# Patient Record
Sex: Female | Born: 1975 | Race: White | Hispanic: No | Marital: Married | Smoking: Former smoker
Health system: Southern US, Community
[De-identification: ages and names within clinical notes are randomized; demographics above are authoritative.]

## PROBLEM LIST (undated history)

## (undated) DIAGNOSIS — E669 Obesity, unspecified: Secondary | ICD-10-CM

## (undated) DIAGNOSIS — Z87442 Personal history of urinary calculi: Secondary | ICD-10-CM

## (undated) DIAGNOSIS — R112 Nausea with vomiting, unspecified: Secondary | ICD-10-CM

## (undated) DIAGNOSIS — G971 Other reaction to spinal and lumbar puncture: Secondary | ICD-10-CM

## (undated) DIAGNOSIS — G709 Myoneural disorder, unspecified: Secondary | ICD-10-CM

## (undated) DIAGNOSIS — R519 Headache, unspecified: Secondary | ICD-10-CM

## (undated) DIAGNOSIS — E785 Hyperlipidemia, unspecified: Secondary | ICD-10-CM

## (undated) DIAGNOSIS — M1990 Unspecified osteoarthritis, unspecified site: Secondary | ICD-10-CM

## (undated) DIAGNOSIS — C801 Malignant (primary) neoplasm, unspecified: Secondary | ICD-10-CM

## (undated) DIAGNOSIS — J449 Chronic obstructive pulmonary disease, unspecified: Secondary | ICD-10-CM

## (undated) DIAGNOSIS — Z9889 Other specified postprocedural states: Secondary | ICD-10-CM

## (undated) DIAGNOSIS — N189 Chronic kidney disease, unspecified: Secondary | ICD-10-CM

## (undated) DIAGNOSIS — G4700 Insomnia, unspecified: Secondary | ICD-10-CM

## (undated) DIAGNOSIS — R51 Headache: Secondary | ICD-10-CM

## (undated) DIAGNOSIS — Z5189 Encounter for other specified aftercare: Secondary | ICD-10-CM

## (undated) DIAGNOSIS — I10 Essential (primary) hypertension: Secondary | ICD-10-CM

## (undated) DIAGNOSIS — Z8669 Personal history of other diseases of the nervous system and sense organs: Secondary | ICD-10-CM

## (undated) DIAGNOSIS — M797 Fibromyalgia: Secondary | ICD-10-CM

## (undated) DIAGNOSIS — F419 Anxiety disorder, unspecified: Secondary | ICD-10-CM

## (undated) DIAGNOSIS — D649 Anemia, unspecified: Secondary | ICD-10-CM

## (undated) DIAGNOSIS — R7303 Prediabetes: Secondary | ICD-10-CM

## (undated) DIAGNOSIS — K219 Gastro-esophageal reflux disease without esophagitis: Secondary | ICD-10-CM

## (undated) HISTORY — DX: Essential (primary) hypertension: I10

## (undated) HISTORY — PX: DILATION AND CURETTAGE OF UTERUS: SHX78

## (undated) HISTORY — DX: Fibromyalgia: M79.7

## (undated) HISTORY — DX: Chronic obstructive pulmonary disease, unspecified: J44.9

## (undated) HISTORY — DX: Encounter for other specified aftercare: Z51.89

## (undated) HISTORY — PX: BLADDER INSTILLATION: SUR156

## (undated) HISTORY — PX: OTHER SURGICAL HISTORY: SHX169

## (undated) HISTORY — DX: Chronic kidney disease, unspecified: N18.9

## (undated) HISTORY — DX: Obesity, unspecified: E66.9

## (undated) HISTORY — DX: Unspecified osteoarthritis, unspecified site: M19.90

## (undated) HISTORY — PX: TUBAL LIGATION: SHX77

## (undated) HISTORY — DX: Hyperlipidemia, unspecified: E78.5

## (undated) HISTORY — DX: Malignant (primary) neoplasm, unspecified: C80.1

## (undated) HISTORY — DX: Insomnia, unspecified: G47.00

---

## 2017-05-22 ENCOUNTER — Other Ambulatory Visit (HOSPITAL_COMMUNITY): Payer: Self-pay | Admitting: General Surgery

## 2017-05-25 ENCOUNTER — Other Ambulatory Visit: Payer: Self-pay

## 2017-05-25 ENCOUNTER — Ambulatory Visit (HOSPITAL_COMMUNITY)
Admission: RE | Admit: 2017-05-25 | Discharge: 2017-05-25 | Disposition: A | Discharge status: Home / Self Care | Payer: Medicare Other | Source: Ambulatory Visit | Attending: General Surgery | Admitting: General Surgery

## 2017-06-08 ENCOUNTER — Ambulatory Visit: Payer: Medicare Other | Admitting: Skilled Nursing Facility1

## 2017-06-11 ENCOUNTER — Encounter: Payer: Self-pay | Admitting: Skilled Nursing Facility1

## 2017-06-11 ENCOUNTER — Encounter: Payer: Medicare Other | Attending: General Surgery | Admitting: Skilled Nursing Facility1

## 2017-06-11 DIAGNOSIS — Z713 Dietary counseling and surveillance: Secondary | ICD-10-CM | POA: Diagnosis present

## 2017-06-11 DIAGNOSIS — Z6841 Body Mass Index (BMI) 40.0 and over, adult: Secondary | ICD-10-CM | POA: Insufficient documentation

## 2017-06-11 DIAGNOSIS — E669 Obesity, unspecified: Secondary | ICD-10-CM

## 2017-06-11 NOTE — Progress Notes (Addendum)
Pre-Op Assessment Visit:  Pre-Operative Sleeve Gastrectomy Surgery  Medical Nutrition Therapy:  Appt start time: 2:00  End time:  3:15  Patient was seen on 06/11/2017 for Pre-Operative Nutrition Assessment. Assessment and letter of approval faxed to [facility] Bariatric Surgery Program coordinator on 06/11/2017.   Pt states she feels like she is starving all the time: with no discernable fullness feeling. Pt states swimming is the only relief for her joints. Pt states she has stress/lack of sleep migraines often. Pt states she wants to move more but needs to lose weight first. Pt states she craves potato chips but does not like potato chips and eating a lot of eggs and not understanding why. Pt states 30 minute after eating: sweaty, lightheaded, and dizzy and a feeling of an empty stomach. Pt states she wakes up int the middle of the night feeling absolutely starving all happening in the last 2 years. Pt has a hx of gestational diabetes and has prediabetes (A1C 6).  Pt was given one touch verio lot: [id number] x exp: 09/14/18 got the number of 135 4 hours after eating.   Pt expectation of surgery: for my life to not revolve around food and weight, to not feel so hungry all the time  Pt expectation of Dietitian: I do not know  Start weight at [facility]: 217.4 BMI: 42.04   24 hr Dietary Recall: First Meal: scrambled eggs and mayo or oatmeal Snack: lemons Second Meal: sandwich with scrambled eggs and mayo or banana and mayo sandwich Snack: potato chips Third Meal: meatloaf mashed potatoes, corn, biscuits or spagetti and garlic bread Snack:  Beverages: flavored water, regular coffee  Encouraged to engage in 150 minutes of moderate physical activity including cardiovascular and weight baring weekly  Handouts given during visit include:  . Pre-Op Goals . Bariatric Surgery Protein Shakes During the appointment today the following Pre-Op Goals were reviewed with the patient: . Maintain  or lose weight as instructed by your surgeon . Make healthy food choices . Begin to limit portion sizes . Limited concentrated sugars and fried foods . Keep fat/sugar in the single digits per serving on             food labels . Practice CHEWING your food  (aim for 30 chews per bite or until applesauce consistency) . Practice not drinking 15 minutes before, during, and 30 minutes after each meal/snack . Avoid all carbonated beverages  . Avoid/limit caffeinated beverages  . Avoid all sugar-sweetened beverages . Consume 3 meals per day; eat every 3-5 hours . Make a list of non-food related activities . Aim for 64-100 ounces of FLUID daily  . Aim for at least 60-80 grams of PROTEIN daily . Look for a liquid protein source that contain ?15 g protein and ?5 g carbohydrate  (ex: shakes, drinks, shots) -Check your blood sugar every time you feel starving and bring the log to pre-op class -Follow diet recommendations listed below   Energy and Macronutrient Recomendations: Calories: 1500 Carbohydrate: 170 Protein: 112 Fat: 42  Demonstrated degree of understanding via:  Teach Back  Teaching Method Utilized:  Visual Auditory Hands on  Barriers to learning/adherence to lifestyle change: unknown possibly hormonal issue causing hunger/fullness confusion  Patient to call the [facility] to enroll in Pre-Op and Post-Op Nutrition Education when surgery date is scheduled.

## 2017-06-29 ENCOUNTER — Encounter: Payer: Medicare Other | Attending: General Surgery | Admitting: Registered"

## 2017-06-29 ENCOUNTER — Ambulatory Visit: Payer: Medicare Other

## 2017-06-29 DIAGNOSIS — Z6841 Body Mass Index (BMI) 40.0 and over, adult: Secondary | ICD-10-CM | POA: Insufficient documentation

## 2017-06-29 DIAGNOSIS — E669 Obesity, unspecified: Secondary | ICD-10-CM

## 2017-06-29 DIAGNOSIS — Z713 Dietary counseling and surveillance: Secondary | ICD-10-CM | POA: Insufficient documentation

## 2017-06-29 NOTE — Progress Notes (Signed)
  Pre-Operative Nutrition Class:  Appt start time: 8:15   End time:  9:15.  Patient was seen on 06/29/2017 for Pre-Operative Bariatric Surgery Education at the [facility].   Surgery date: 07/13/2017 Surgery type: Sleeve gastrectomy Start weight at [facility]: 217.4 Weight today: 220.2   Samples given per MNT protocol. Patient educated on appropriate usage: Bariatric Advantage Multivitamin Lot # [id number] Exp: 03/2018  Bariatric Advantage Calcium Citrate (peanut butter chocolate) Lot # [id number] Exp: 07/17/2017  Bariatric Advantage Calcium Citrate (lemon) Lot # [id number] Exp: 05/28/2018  Renee Pain Protein Powder (chocolate) Lot # [id number] Exp: 11/2017   Unjury Protein Powder (strawberry) Lot # [id number] Exp: 11/2017   The following the learning objectives were met by the patient during this course:  Identify Pre-Op Dietary Goals and will begin 2 weeks pre-operatively  Identify appropriate sources of fluids and proteins   State protein recommendations and appropriate sources pre and post-operatively  Identify Post-Operative Dietary Goals and will follow for 2 weeks post-operatively  Identify appropriate multivitamin and calcium sources  Describe the need for physical activity post-operatively and will follow MD recommendations  State when to call healthcare provider regarding medication questions or post-operative complications  Handouts given during class include:  Pre-Op Bariatric Surgery Diet Handout  Protein Shake Handout  Post-Op Bariatric Surgery Nutrition Handout  [facility] Program Information Flyer  Support Group Information Flyer  [facility] Bariatric Supplements Price List  Follow-Up Plan: Patient will follow-up at [facility] 2 weeks post operatively for diet advancement per MD.

## 2017-06-30 NOTE — Progress Notes (Signed)
Please place orders in EPIC as patient is being scheduled for a pre-op appointment! Thank you! 

## 2017-07-01 NOTE — Progress Notes (Signed)
Please place orders in EPIC as patient is being scheduled for a pre-op appointment! Thank you! 

## 2017-07-02 ENCOUNTER — Ambulatory Visit: Payer: Self-pay | Admitting: General Surgery

## 2017-07-02 NOTE — H&P (Signed)
Nicole [name] 07/02/2017 3:47 PM Location: [facility] Patient #: [id number] DOB: [birth year] Married / Language: [name] / Race: White Female  History of Present Illness Randall Hiss M. Redford Behrle MD; 07/02/2017 6:20 PM) The patient is a 41 year old female who presents for a bariatric surgery evaluation. She comes in today for her preoperative appointment. She is in the process of being approved for laparoscopic sleeve gastrectomy by the insurance company. I initially met her about 6 weeks ago. She denies any medical changes since her initial visit. She denies any trips the emergency room or hospital. Her upper GI was unremarkable. Her EKG was unremarkable. Chest x-ray was within normal limits. She had had most of her bariatric evaluation labs done in May 2018. A metabolic panel and CBC were within normal limits. An essentially normal lipid panel. Labs that we check revealed an iron level of 26 which is low. and prediabetes with the A1c of 6.0. Her sleep study from 3 years ago did show no evidence of significant sleep apnea.  A comprehensive 12 point review of systems was performed and all systems are negative except for what is mentioned in the HPI  05/20/2017 She is referred by [name] PA-C for evaluation of weight loss surgery. She completed our Neurosurgeon. She also attended another programs seminar in person. She is interested in the sleeve gastrectomy. She states that she wants to take control of her life again. Despite numerous attempts were sustained weight loss she has been unsuccessful. She has tried [name], [name], [name], Keystone, biggest loser contest, [name], and phentermine-all without any long-term success.  Her comorbidities include hypertension multiple joint pain, fibromyalgia, GERD  She denies any chest pain, chest pressure, source of breath, orthopnea, paroxysmal nocturnal dyspnea, TIAs or amaurosis fugax. She denies any personal  history of blood clots. She occasionally may have some dyspnea on exertion. She states that she had a normal sleep study in 2015. She use to be a heavy smoker but quit in 2016. She takes medicine for heartburn. She states that she does not take her medication she will have burning in her esophagus and stomach. She denies any nausea, vomiting, melena or hematochezia. She states that she has IBS alternating between diarrhea and constipation. She denies any history of Crohn's or ulcerative colitis. She has irregular menses. She has had a tubal ligation. She has a history kidney stones. She has a stimulator for a week the bladder. She has joint pain mainly in her hands and elbows. She denies any polyuria or polydipsia. She has occasional migraines only when she takes phentermine. She denies any current tobacco usage. She drinks alcohol on occasional basis. She denies any drug use.   Problem List/Past Medical [name], MD; 07/02/2017 6:22 PM) GERD WITHOUT ESOPHAGITIS (K21.9) FIBROMYALGIA (M79.7) OBESITY, MORBID, BMI 40.0-49.9 (E66.01)  Past Surgical History [name], MD; 07/02/2017 6:22 PM) Breast Augmentation Bilateral. Knee Surgery Left. Oral Surgery Spinal Surgery - Neck  Diagnostic Studies History [name], MD; 07/02/2017 6:22 PM) Colonoscopy 5-10 years ago Mammogram 1-3 years ago Pap Smear 1-5 years ago  Allergies [name], RMA; 07/02/2017 3:47 PM) Codeine Phosphate *ANALGESICS - OPIOID* Itching, Nausea. Allergies Reconciled  Medication History [name], MD; 07/02/2017 6:22 PM) [name] ('350MG'$  Tablet, Oral) Active. Escitalopram Oxalate ('10MG'$  Tablet, Oral) Active. Omeprazole ('40MG'$  Capsule DR, Oral) Active. Propranolol HCl ('80MG'$  Tablet, Oral) Active. Melatonin ER ('10MG'$  Tablet ER, Oral) Active. Prenatal Vitamin (Oral) Active. Magnesium ('500MG'$  Tablet, Oral) Active.  Probiotic Daily (Oral) Active. Calcium (Oral)  Specific strength unknown - Active. Medications Reconciled OxyCODONE HCl ('5MG'$ /5ML Solution, 5-10 Milliliter Oral every four hours, as needed, Taken starting 07/02/2017) Active. Zofran ODT ('4MG'$  Tablet Disint, 1 (one) Tablet Oral every six hours, as needed, Taken starting 07/02/2017) Active. (As needed for nasuea)  Social History [name], MD; 07/02/2017 6:22 PM) Alcohol use Occasional alcohol use. Caffeine use Carbonated beverages, Coffee. No drug use Tobacco use Former smoker.  Family History [name], MD; 07/02/2017 6:22 PM) Alcohol Abuse Brother, Family Members In General, Father, Mother. Breast Cancer Mother. Colon Cancer Father. Heart disease in female family member before age 63 Heart disease in female family member before age 61 Hypertension Father. Migraine Headache Daughter. Prostate Cancer Father.  Pregnancy / Birth History [name], MD; 07/02/2017 6:22 PM) Age at menarche 43 years. Contraceptive History Depo-provera, Intrauterine device, Oral contraceptives. Gravida 4 Irregular periods Length (months) of breastfeeding 7-12 Maternal age 48-20 Para 4  Other Problems [name], MD; 07/02/2017 6:22 PM) Arthritis Back Pain Bladder Problems Cervical Cancer Chronic Obstructive Lung Disease Depression Gastroesophageal Reflux Disease Hemorrhoids Hypercholesterolemia Kidney Stone Migraine Headache DEPRESSION WITH ANXIETY (F41.8) PREDIABETES (R73.03) HYPERTENSION, ESSENTIAL (I10)    Vitals [name] RMA; 07/02/2017 3:51 PM) 07/02/2017 3:51 PM Weight: 221 lb Height: 60in Body Surface Area: 1.95 m Body Mass Index: 43.16 kg/m  Temp.: 98.38F  Pulse: 93 (Regular)  BP: 124/80 (Sitting, Left Arm, Standard)      Physical Exam Randall Hiss M. Caroleann Casler MD; 07/02/2017 6:17 PM)  General Mental Status-Alert. General Appearance-Consistent with stated age. Hydration-Well  hydrated. Voice-Normal.  Head and Neck Head-normocephalic, atraumatic with no lesions or palpable masses. Trachea-midline. Thyroid Gland Characteristics - normal size and consistency.  Eye Eyeball - Bilateral-Extraocular movements intact. Sclera/Conjunctiva - Bilateral-No scleral icterus.  ENMT Ears -Note:normal ext ears.  Mouth and Throat -Note:lips intact.   Chest and Lung Exam Chest and lung exam reveals -quiet, even and easy respiratory effort with no use of accessory muscles and on auscultation, normal breath sounds, no adventitious sounds and normal vocal resonance. Inspection Chest Wall - Normal. Back - normal.  Breast - Did not examine.  Cardiovascular Cardiovascular examination reveals -normal heart sounds, regular rate and rhythm with no murmurs and normal pedal pulses bilaterally.  Abdomen Inspection Inspection of the abdomen reveals - No Hernias. Skin - Scar - no surgical scars. Palpation/Percussion Palpation and Percussion of the abdomen reveal - Soft, Non Tender, No Rebound tenderness, No Rigidity (guarding) and No hepatosplenomegaly. Auscultation Auscultation of the abdomen reveals - Bowel sounds normal.  Peripheral Vascular Upper Extremity Palpation - Pulses bilaterally normal.  Neurologic Neurologic evaluation reveals -alert and oriented x 3 with no impairment of recent or remote memory. Mental Status-Normal.  Neuropsychiatric The patient's mood and affect are described as -normal. Judgment and Insight-insight is appropriate concerning matters relevant to self.  Musculoskeletal Normal Exam - Left-Upper Extremity Strength Normal and Lower Extremity Strength Normal. Normal Exam - Right-Upper Extremity Strength Normal and Lower Extremity Strength Normal. Note: bilateral knee crepitus  Lymphatic Head & Neck  General Head & Neck Lymphatics: Bilateral - Description - Normal. Axillary - Did not examine. Femoral &  Inguinal - Did not examine.    Assessment & Plan Randall Hiss M. Jovan Schickling MD; 07/02/2017 6:22 PM)  OBESITY, MORBID, BMI 40.0-49.9 (E66.01) Impression: We reviewed her preoperative workup. We discussed the upper GI, chest x-ray, and blood work. We discussed the preoperative diet plan. We discussed the importance of staying hydrated the  day before surgery. She was given her postoperative prescriptions today. We discussed the importance of staying active between now and surgery. We rediscussed the typical hospital as well as postoperative course. She has attended preoperative class. All of her questions were asked and answered.  Current Plans Pt Education - EMW_preopbariatric Started OxyCODONE HCl '5MG'$ /5ML, 5-10 Milliliter every four hours, as needed, 100 Milliliter, 07/02/2017, No Refill. Started Zofran ODT '4MG'$ , 1 (one) Tablet every six hours, as needed, #15, 07/02/2017, No Refill. Local Order: As needed for nasuea FIBROMYALGIA (M79.7)  GERD WITHOUT ESOPHAGITIS (K21.9) Impression: We did discuss that heartburn-reflux may worsen after a sleeve gastrectomy.  DEPRESSION WITH ANXIETY (F41.8)  HYPERTENSION, ESSENTIAL (I10)  PREDIABETES (R73.03)  Leighton Ruff. Redmond Pulling, MD, FACS General, Bariatric, & Minimally Invasive Surgery [facility], [place]

## 2017-07-02 NOTE — Progress Notes (Signed)
Please place orders in EPIC as patient has a pre-op appointment on 07/07/2017! Thank you!

## 2017-07-06 NOTE — Patient Instructions (Signed)
Nicole [name]  07/06/2017   Your procedure is scheduled on: 07/13/2017   Report to [facility] Main  Entrance Take [place]  elevators to 3rd floor to  [place] at     1145 AM.    Call this number if you have problems the morning of surgery [phone]    Remember: ONLY 1 PERSON MAY GO WITH YOU TO SHORT STAY TO GET  READY MORNING OF [place].  Do not eat food after midnite ,  May have clear liquids from 12 midnite until 0730am then nothing by mouth.    Take these medicines the morning of surgery with A SIP OF WATER: Lexapro, Prilosec, propanolol ( Inderal)                                 You may not have any metal on your body including hair pins and              piercings  Do not wear jewelry, make-up, lotions, powders or perfumes, deodorant             Do not wear nail polish.  Do not shave  48 hours prior to surgery.             Do not bring valuables to the hospital. [place].  Contacts, dentures or bridgework may not be worn into surgery.  Leave suitcase in the car. After surgery it may be brought to your room.                       Please read over the following fact sheets you were given: _____________________________________________________________________             [facility] - Preparing for Surgery Before surgery, you can play an important role.  Because skin is not sterile, your skin needs to be as free of germs as possible.  You can reduce the number of germs on your skin by washing with CHG (chlorahexidine gluconate) soap before surgery.  CHG is an antiseptic cleaner which kills germs and bonds with the skin to continue killing germs even after washing. Please DO NOT use if you have an allergy to CHG or antibacterial soaps.  If your skin becomes reddened/irritated stop using the CHG and inform your nurse when you arrive at Short Stay. Do not shave (including legs and  underarms) for at least 48 hours prior to the first CHG shower.  You may shave your face/neck. Please follow these instructions carefully:  1.  Shower with CHG Soap the night before surgery and the  morning of Surgery.  2.  If you choose to wash your hair, wash your hair first as usual with your  normal  shampoo.  3.  After you shampoo, rinse your hair and body thoroughly to remove the  shampoo.                           4.  Use CHG as you would any other liquid soap.  You can apply chg directly  to the skin and wash  Gently with a scrungie or clean washcloth.  5.  Apply the CHG Soap to your body ONLY FROM THE NECK DOWN.   Do not use on face/ open                           Wound or open sores. Avoid contact with eyes, ears mouth and genitals (private parts).                       Wash face,  Genitals (private parts) with your normal soap.             6.  Wash thoroughly, paying special attention to the area where your surgery  will be performed.  7.  Thoroughly rinse your body with warm water from the neck down.  8.  DO NOT shower/wash with your normal soap after using and rinsing off  the CHG Soap.                9.  Pat yourself dry with a clean towel.            10.  Wear clean pajamas.            11.  Place clean sheets on your bed the night of your first shower and do not  sleep with pets. Day of Surgery : Do not apply any lotions/deodorants the morning of surgery.  Please wear clean clothes to the hospital/surgery center.  FAILURE TO FOLLOW THESE INSTRUCTIONS MAY RESULT IN THE CANCELLATION OF YOUR SURGERY PATIENT SIGNATURE_________________________________  NURSE SIGNATURE__________________________________  ________________________________________________________________________    CLEAR LIQUID DIET   Foods Allowed                                                                     Foods Excluded  Coffee and tea, regular and decaf                              liquids that you cannot  Plain Jell-O in any flavor                                             see through such as: Fruit ices (not with fruit pulp)                                     milk, soups, orange juice  Iced Popsicles                                    All solid food Carbonated beverages, regular and diet                                    Cranberry, grape and apple juices Sports drinks like Gatorade Lightly seasoned clear broth or consume(fat free) Sugar, honey syrup  Sample Menu Breakfast                                Lunch                                     Supper Cranberry juice                    Beef broth                            Chicken broth Jell-O                                     Grape juice                           Apple juice Coffee or tea                        Jell-O                                      Popsicle                                                Coffee or tea                        Coffee or tea  _____________________________________________________________________    Incentive Spirometer  An incentive spirometer is a tool that can help keep your lungs clear and active. This tool measures how well you are filling your lungs with each breath. Taking long deep breaths may help reverse or decrease the chance of developing breathing (pulmonary) problems (especially infection) following:  A long period of time when you are unable to move or be active. BEFORE THE PROCEDURE   If the spirometer includes an indicator to show your best effort, your nurse or respiratory therapist will set it to a desired goal.  If possible, sit up straight or lean slightly forward. Try not to slouch.  Hold the incentive spirometer in an upright position. INSTRUCTIONS FOR USE  1. Sit on the edge of your bed if possible, or sit up as far as you can in bed or on a chair. 2. Hold the incentive spirometer in an upright position. 3. Breathe out normally. 4. Place the  mouthpiece in your mouth and seal your lips tightly around it. 5. Breathe in slowly and as deeply as possible, raising the piston or the ball toward the top of the column. 6. Hold your breath for 3-5 seconds or for as long as possible. Allow the piston or ball to fall to the bottom of the column. 7. Remove the mouthpiece from your mouth and breathe out normally. 8. Rest for a few seconds and repeat Steps 1 through 7 at least 10 times every 1-2 hours when you are awake. Take your time and take a few normal breaths between  deep breaths. 9. The spirometer may include an indicator to show your best effort. Use the indicator as a goal to work toward during each repetition. 10. After each set of 10 deep breaths, practice coughing to be sure your lungs are clear. If you have an incision (the cut made at the time of surgery), support your incision when coughing by placing a pillow or rolled up towels firmly against it. Once you are able to get out of bed, walk around indoors and cough well. You may stop using the incentive spirometer when instructed by your caregiver.  RISKS AND COMPLICATIONS  Take your time so you do not get dizzy or light-headed.  If you are in pain, you may need to take or ask for pain medication before doing incentive spirometry. It is harder to take a deep breath if you are having pain. AFTER USE  Rest and breathe slowly and easily.  It can be helpful to keep track of a log of your progress. Your caregiver can provide you with a simple table to help with this. If you are using the spirometer at home, follow these instructions: [place] IF:   You are having difficultly using the spirometer.  You have trouble using the spirometer as often as instructed.  Your pain medication is not giving enough relief while using the spirometer.  You develop fever of 100.5 F (38.1 C) or higher. SEEK IMMEDIATE MEDICAL CARE IF:   You cough up bloody sputum that had not been present  before.  You develop fever of 102 F (38.9 C) or greater.  You develop worsening pain at or near the incision site. MAKE SURE YOU:   Understand these instructions.  Will watch your condition.  Will get help right away if you are not doing well or get worse. Document Released: 01/12/2007 Document Revised: 11/24/2011 Document Reviewed: 03/15/2007 [facility] Patient Information 2014 [place], [place].   ________________________________________________________________________

## 2017-07-07 ENCOUNTER — Encounter (HOSPITAL_COMMUNITY): Payer: Self-pay

## 2017-07-07 ENCOUNTER — Encounter (HOSPITAL_COMMUNITY)
Admission: RE | Admit: 2017-07-07 | Discharge: 2017-07-07 | Disposition: A | Discharge status: Home / Self Care | Payer: Medicare Other | Source: Ambulatory Visit | Attending: General Surgery | Admitting: General Surgery

## 2017-07-07 DIAGNOSIS — K219 Gastro-esophageal reflux disease without esophagitis: Secondary | ICD-10-CM | POA: Diagnosis not present

## 2017-07-07 DIAGNOSIS — Z79899 Other long term (current) drug therapy: Secondary | ICD-10-CM | POA: Diagnosis not present

## 2017-07-07 DIAGNOSIS — Z01818 Encounter for other preprocedural examination: Secondary | ICD-10-CM | POA: Diagnosis present

## 2017-07-07 DIAGNOSIS — I10 Essential (primary) hypertension: Secondary | ICD-10-CM | POA: Diagnosis not present

## 2017-07-07 HISTORY — DX: Anemia, unspecified: D64.9

## 2017-07-07 HISTORY — DX: Gastro-esophageal reflux disease without esophagitis: K21.9

## 2017-07-07 HISTORY — DX: Myoneural disorder, unspecified: G70.9

## 2017-07-07 HISTORY — DX: Prediabetes: R73.03

## 2017-07-07 HISTORY — DX: Headache, unspecified: R51.9

## 2017-07-07 HISTORY — DX: Headache: R51

## 2017-07-07 HISTORY — DX: Other specified postprocedural states: Z98.890

## 2017-07-07 HISTORY — DX: Other specified postprocedural states: R11.2

## 2017-07-07 HISTORY — DX: Anxiety disorder, unspecified: F41.9

## 2017-07-07 HISTORY — DX: Personal history of urinary calculi: Z87.442

## 2017-07-07 LAB — COMPREHENSIVE METABOLIC PANEL
ALT: 21 U/L (ref 14–54)
ANION GAP: 8 (ref 5–15)
AST: 20 U/L (ref 15–41)
Albumin: 3.8 g/dL (ref 3.5–5.0)
Alkaline Phosphatase: 59 U/L (ref 38–126)
BUN: 16 mg/dL (ref 6–20)
CALCIUM: 9.3 mg/dL (ref 8.9–10.3)
CO2: 28 mmol/L (ref 22–32)
CREATININE: 0.89 mg/dL (ref 0.44–1.00)
Chloride: 103 mmol/L (ref 101–111)
GLUCOSE: 91 mg/dL (ref 65–99)
Potassium: 4.3 mmol/L (ref 3.5–5.1)
Sodium: 139 mmol/L (ref 135–145)
TOTAL PROTEIN: 7.2 g/dL (ref 6.5–8.1)
Total Bilirubin: 0.5 mg/dL (ref 0.3–1.2)

## 2017-07-07 LAB — CBC WITH DIFFERENTIAL/PLATELET
Basophils Absolute: 0.1 10*3/uL (ref 0.0–0.1)
Basophils Relative: 1 %
EOS PCT: 2 %
Eosinophils Absolute: 0.1 10*3/uL (ref 0.0–0.7)
HEMATOCRIT: 39.2 % (ref 36.0–46.0)
Hemoglobin: 12.6 g/dL (ref 12.0–15.0)
Lymphocytes Relative: 32 %
Lymphs Abs: 2.5 10*3/uL (ref 0.7–4.0)
MCH: 26.1 pg (ref 26.0–34.0)
MCHC: 32.1 g/dL (ref 30.0–36.0)
MCV: 81.3 fL (ref 78.0–100.0)
Monocytes Absolute: 0.8 10*3/uL (ref 0.1–1.0)
Monocytes Relative: 9 %
Neutro Abs: 4.5 10*3/uL (ref 1.7–7.7)
Neutrophils Relative %: 56 %
PLATELETS: 361 10*3/uL (ref 150–400)
RBC: 4.82 MIL/uL (ref 3.87–5.11)
RDW: 14.6 % (ref 11.5–15.5)
WBC: 8 10*3/uL (ref 4.0–10.5)

## 2017-07-07 NOTE — Progress Notes (Signed)
05/25/17-ekg and cxr-epic

## 2017-07-08 ENCOUNTER — Encounter (HOSPITAL_COMMUNITY): Payer: Self-pay

## 2017-07-08 ENCOUNTER — Encounter (HOSPITAL_COMMUNITY): Payer: Self-pay | Admitting: *Deleted

## 2017-07-08 LAB — HEMOGLOBIN A1C
HEMOGLOBIN A1C: 6.1 % — AB (ref 4.8–5.6)
Mean Plasma Glucose: 128 mg/dL

## 2017-07-10 ENCOUNTER — Telehealth (HOSPITAL_COMMUNITY): Payer: Self-pay

## 2017-07-13 ENCOUNTER — Inpatient Hospital Stay (HOSPITAL_COMMUNITY): Payer: Medicare Other | Admitting: Anesthesiology

## 2017-07-13 ENCOUNTER — Encounter (HOSPITAL_COMMUNITY)
Admission: RE | Disposition: A | Discharge status: Home / Self Care | Payer: Self-pay | Source: Ambulatory Visit | Attending: General Surgery

## 2017-07-13 ENCOUNTER — Encounter (HOSPITAL_COMMUNITY): Payer: Self-pay | Admitting: Emergency Medicine

## 2017-07-13 ENCOUNTER — Inpatient Hospital Stay (HOSPITAL_COMMUNITY)
Admission: RE | Admit: 2017-07-13 | Discharge: 2017-07-14 | DRG: 621 | Disposition: A | Discharge status: Home / Self Care | Payer: Medicare Other | Source: Ambulatory Visit | Attending: General Surgery | Admitting: General Surgery

## 2017-07-13 DIAGNOSIS — Z87442 Personal history of urinary calculi: Secondary | ICD-10-CM

## 2017-07-13 DIAGNOSIS — G8929 Other chronic pain: Secondary | ICD-10-CM | POA: Diagnosis present

## 2017-07-13 DIAGNOSIS — Z8249 Family history of ischemic heart disease and other diseases of the circulatory system: Secondary | ICD-10-CM

## 2017-07-13 DIAGNOSIS — R7303 Prediabetes: Secondary | ICD-10-CM | POA: Diagnosis present

## 2017-07-13 DIAGNOSIS — M797 Fibromyalgia: Secondary | ICD-10-CM | POA: Diagnosis present

## 2017-07-13 DIAGNOSIS — G43909 Migraine, unspecified, not intractable, without status migrainosus: Secondary | ICD-10-CM | POA: Diagnosis present

## 2017-07-13 DIAGNOSIS — M2550 Pain in unspecified joint: Secondary | ICD-10-CM

## 2017-07-13 DIAGNOSIS — Z800 Family history of malignant neoplasm of digestive organs: Secondary | ICD-10-CM

## 2017-07-13 DIAGNOSIS — Z87891 Personal history of nicotine dependence: Secondary | ICD-10-CM

## 2017-07-13 DIAGNOSIS — F418 Other specified anxiety disorders: Secondary | ICD-10-CM | POA: Diagnosis present

## 2017-07-13 DIAGNOSIS — Z8042 Family history of malignant neoplasm of prostate: Secondary | ICD-10-CM | POA: Diagnosis not present

## 2017-07-13 DIAGNOSIS — I10 Essential (primary) hypertension: Secondary | ICD-10-CM | POA: Diagnosis present

## 2017-07-13 DIAGNOSIS — Z6841 Body Mass Index (BMI) 40.0 and over, adult: Secondary | ICD-10-CM | POA: Diagnosis not present

## 2017-07-13 DIAGNOSIS — Z8541 Personal history of malignant neoplasm of cervix uteri: Secondary | ICD-10-CM | POA: Diagnosis not present

## 2017-07-13 DIAGNOSIS — J449 Chronic obstructive pulmonary disease, unspecified: Secondary | ICD-10-CM | POA: Diagnosis present

## 2017-07-13 DIAGNOSIS — K219 Gastro-esophageal reflux disease without esophagitis: Secondary | ICD-10-CM | POA: Diagnosis present

## 2017-07-13 DIAGNOSIS — Z9884 Bariatric surgery status: Secondary | ICD-10-CM

## 2017-07-13 DIAGNOSIS — Z803 Family history of malignant neoplasm of breast: Secondary | ICD-10-CM | POA: Diagnosis not present

## 2017-07-13 DIAGNOSIS — E7800 Pure hypercholesterolemia, unspecified: Secondary | ICD-10-CM | POA: Diagnosis present

## 2017-07-13 DIAGNOSIS — Z885 Allergy status to narcotic agent status: Secondary | ICD-10-CM

## 2017-07-13 DIAGNOSIS — E66813 Obesity, class 3: Secondary | ICD-10-CM | POA: Diagnosis present

## 2017-07-13 DIAGNOSIS — Z811 Family history of alcohol abuse and dependence: Secondary | ICD-10-CM

## 2017-07-13 HISTORY — PX: LAPAROSCOPIC GASTRIC SLEEVE RESECTION: SHX5895

## 2017-07-13 LAB — HEMOGLOBIN AND HEMATOCRIT, BLOOD
HCT: 38.8 % (ref 36.0–46.0)
HEMOGLOBIN: 12.9 g/dL (ref 12.0–15.0)

## 2017-07-13 LAB — PREGNANCY, URINE: PREG TEST UR: NEGATIVE

## 2017-07-13 LAB — GLUCOSE, CAPILLARY: Glucose-Capillary: 105 mg/dL — ABNORMAL HIGH (ref 65–99)

## 2017-07-13 SURGERY — GASTRECTOMY, SLEEVE, LAPAROSCOPIC
Anesthesia: General | Site: Abdomen

## 2017-07-13 MED ORDER — ACETAMINOPHEN 500 MG PO TABS
1000.0000 mg | ORAL_TABLET | ORAL | Status: AC
Start: 2017-07-13 — End: 2017-07-13
  Administered 2017-07-13: 1000 mg via ORAL
  Filled 2017-07-13: qty 2

## 2017-07-13 MED ORDER — ROCURONIUM BROMIDE 50 MG/5ML IV SOSY
PREFILLED_SYRINGE | INTRAVENOUS | Status: AC
  Filled 2017-07-13: qty 5

## 2017-07-13 MED ORDER — HEPARIN SODIUM (PORCINE) 5000 UNIT/ML IJ SOLN
5000.0000 [IU] | INTRAMUSCULAR | Status: AC
  Administered 2017-07-13: 5000 [IU] via SUBCUTANEOUS
  Filled 2017-07-13: qty 1

## 2017-07-13 MED ORDER — PHENYLEPHRINE 40 MCG/ML (10ML) SYRINGE FOR IV PUSH (FOR BLOOD PRESSURE SUPPORT)
PREFILLED_SYRINGE | INTRAVENOUS | Status: DC | PRN
  Administered 2017-07-13 (×2): 80 ug via INTRAVENOUS
  Administered 2017-07-13: 120 ug via INTRAVENOUS
  Administered 2017-07-13: 80 ug via INTRAVENOUS
  Administered 2017-07-13: 40 ug via INTRAVENOUS

## 2017-07-13 MED ORDER — CHLORHEXIDINE GLUCONATE 4 % EX LIQD: 60.0000 mL | Freq: Once | CUTANEOUS | Status: DC

## 2017-07-13 MED ORDER — FENTANYL CITRATE (PF) 100 MCG/2ML IJ SOLN
INTRAMUSCULAR | Status: AC
Start: 2017-07-13 — End: 2017-07-14
  Filled 2017-07-13: qty 2

## 2017-07-13 MED ORDER — PROPOFOL 10 MG/ML IV BOLUS
INTRAVENOUS | Status: AC
  Filled 2017-07-13: qty 20

## 2017-07-13 MED ORDER — LIDOCAINE 2% (20 MG/ML) 5 ML SYRINGE
INTRAMUSCULAR | Status: AC
  Filled 2017-07-13: qty 5

## 2017-07-13 MED ORDER — METOPROLOL TARTRATE 5 MG/5ML IV SOLN
5.0000 mg | Freq: Four times a day (QID) | INTRAVENOUS | Status: DC
  Administered 2017-07-13 – 2017-07-14 (×3): 5 mg via INTRAVENOUS
  Filled 2017-07-13 (×5): qty 5

## 2017-07-13 MED ORDER — SCOPOLAMINE 1 MG/3DAYS TD PT72
1.0000 | MEDICATED_PATCH | TRANSDERMAL | Status: DC
  Administered 2017-07-13: 1.5 mg via TRANSDERMAL
  Filled 2017-07-13: qty 1

## 2017-07-13 MED ORDER — ENALAPRILAT 1.25 MG/ML IV SOLN
1.2500 mg | Freq: Four times a day (QID) | INTRAVENOUS | Status: DC | PRN
  Filled 2017-07-13: qty 1

## 2017-07-13 MED ORDER — ONDANSETRON HCL 4 MG/2ML IJ SOLN
INTRAMUSCULAR | Status: AC
  Filled 2017-07-13: qty 2

## 2017-07-13 MED ORDER — MIDAZOLAM HCL 5 MG/5ML IJ SOLN
INTRAMUSCULAR | Status: DC | PRN
  Administered 2017-07-13: 2 mg via INTRAVENOUS

## 2017-07-13 MED ORDER — ENOXAPARIN SODIUM 30 MG/0.3ML ~~LOC~~ SOLN
30.0000 mg | Freq: Two times a day (BID) | SUBCUTANEOUS | Status: DC
  Administered 2017-07-13: 30 mg via SUBCUTANEOUS
  Filled 2017-07-13 (×2): qty 0.3

## 2017-07-13 MED ORDER — LACTATED RINGERS IV SOLN
INTRAVENOUS | Status: DC
  Administered 2017-07-13 (×2): via INTRAVENOUS

## 2017-07-13 MED ORDER — SUCCINYLCHOLINE CHLORIDE 200 MG/10ML IV SOSY
PREFILLED_SYRINGE | INTRAVENOUS | Status: DC | PRN
  Administered 2017-07-13: 120 mg via INTRAVENOUS

## 2017-07-13 MED ORDER — KETAMINE HCL 10 MG/ML IJ SOLN
INTRAMUSCULAR | Status: DC | PRN
  Administered 2017-07-13: 25 mg via INTRAVENOUS

## 2017-07-13 MED ORDER — SODIUM CHLORIDE 0.9 % IJ SOLN
INTRAMUSCULAR | Status: AC
  Filled 2017-07-13: qty 50

## 2017-07-13 MED ORDER — DIPHENHYDRAMINE HCL 50 MG/ML IJ SOLN
12.5000 mg | Freq: Four times a day (QID) | INTRAMUSCULAR | Status: DC | PRN
  Administered 2017-07-13: 12.5 mg via INTRAVENOUS

## 2017-07-13 MED ORDER — DIPHENHYDRAMINE HCL 50 MG/ML IJ SOLN
12.5000 mg | Freq: Three times a day (TID) | INTRAMUSCULAR | Status: DC | PRN
Start: 2017-07-13 — End: 2017-07-14
  Administered 2017-07-13: 12.5 mg via INTRAVENOUS
  Filled 2017-07-13: qty 1

## 2017-07-13 MED ORDER — 0.9 % SODIUM CHLORIDE (POUR BTL) OPTIME
TOPICAL | Status: DC | PRN
  Administered 2017-07-13: 1000 mL

## 2017-07-13 MED ORDER — ROCURONIUM BROMIDE 50 MG/5ML IV SOSY
PREFILLED_SYRINGE | INTRAVENOUS | Status: DC | PRN
  Administered 2017-07-13: 50 mg via INTRAVENOUS
  Administered 2017-07-13: 10 mg via INTRAVENOUS

## 2017-07-13 MED ORDER — HYDROMORPHONE HCL 1 MG/ML IJ SOLN
INTRAMUSCULAR | Status: AC
  Filled 2017-07-13: qty 1

## 2017-07-13 MED ORDER — MORPHINE SULFATE (PF) 2 MG/ML IV SOLN
1.0000 mg | INTRAVENOUS | Status: DC | PRN
  Administered 2017-07-13 (×2): 2 mg via INTRAVENOUS
  Filled 2017-07-13 (×2): qty 1

## 2017-07-13 MED ORDER — DEXAMETHASONE SODIUM PHOSPHATE 4 MG/ML IJ SOLN
4.0000 mg | INTRAMUSCULAR | Status: AC
  Administered 2017-07-13: 4 mg via INTRAVENOUS

## 2017-07-13 MED ORDER — HYDROMORPHONE HCL 1 MG/ML IJ SOLN
0.2500 mg | INTRAMUSCULAR | Status: DC | PRN
  Administered 2017-07-13 (×2): 0.5 mg via INTRAVENOUS

## 2017-07-13 MED ORDER — APREPITANT 40 MG PO CAPS
40.0000 mg | ORAL_CAPSULE | ORAL | Status: AC
  Administered 2017-07-13: 40 mg via ORAL
  Filled 2017-07-13: qty 1

## 2017-07-13 MED ORDER — DEXAMETHASONE SODIUM PHOSPHATE 10 MG/ML IJ SOLN
INTRAMUSCULAR | Status: AC
  Filled 2017-07-13: qty 1

## 2017-07-13 MED ORDER — SIMETHICONE 80 MG PO CHEW
80.0000 mg | CHEWABLE_TABLET | Freq: Four times a day (QID) | ORAL | Status: DC | PRN
  Administered 2017-07-14: 80 mg via ORAL
  Filled 2017-07-13: qty 1

## 2017-07-13 MED ORDER — PANTOPRAZOLE SODIUM 40 MG IV SOLR
40.0000 mg | Freq: Every day | INTRAVENOUS | Status: DC
  Administered 2017-07-13: 40 mg via INTRAVENOUS
  Filled 2017-07-13: qty 40

## 2017-07-13 MED ORDER — GABAPENTIN 300 MG PO CAPS
300.0000 mg | ORAL_CAPSULE | ORAL | Status: AC
  Administered 2017-07-13: 300 mg via ORAL
  Filled 2017-07-13: qty 1

## 2017-07-13 MED ORDER — OXYCODONE HCL 5 MG/5ML PO SOLN: 5.0000 mg | ORAL | Status: DC | PRN

## 2017-07-13 MED ORDER — PROMETHAZINE HCL 25 MG/ML IJ SOLN: 12.5000 mg | Freq: Four times a day (QID) | INTRAMUSCULAR | Status: DC | PRN

## 2017-07-13 MED ORDER — ONDANSETRON HCL 4 MG/2ML IJ SOLN: 4.0000 mg | Freq: Four times a day (QID) | INTRAMUSCULAR | Status: DC | PRN

## 2017-07-13 MED ORDER — KETAMINE HCL-SODIUM CHLORIDE 100-0.9 MG/10ML-% IV SOSY
PREFILLED_SYRINGE | INTRAVENOUS | Status: AC
  Filled 2017-07-13: qty 10

## 2017-07-13 MED ORDER — KCL IN DEXTROSE-NACL 20-5-0.45 MEQ/L-%-% IV SOLN
INTRAVENOUS | Status: DC
  Administered 2017-07-13: 1000 mL via INTRAVENOUS
  Filled 2017-07-13 (×2): qty 1000

## 2017-07-13 MED ORDER — LIDOCAINE 2% (20 MG/ML) 5 ML SYRINGE
INTRAMUSCULAR | Status: DC | PRN
  Administered 2017-07-13: 100 mg via INTRAVENOUS

## 2017-07-13 MED ORDER — EPHEDRINE 5 MG/ML INJ
INTRAVENOUS | Status: AC
  Filled 2017-07-13: qty 10

## 2017-07-13 MED ORDER — SUGAMMADEX SODIUM 200 MG/2ML IV SOLN
INTRAVENOUS | Status: DC | PRN
  Administered 2017-07-13: 200 mg via INTRAVENOUS

## 2017-07-13 MED ORDER — FENTANYL CITRATE (PF) 100 MCG/2ML IJ SOLN
INTRAMUSCULAR | Status: AC
  Filled 2017-07-13: qty 2

## 2017-07-13 MED ORDER — MIDAZOLAM HCL 2 MG/2ML IJ SOLN
INTRAMUSCULAR | Status: AC
  Filled 2017-07-13: qty 2

## 2017-07-13 MED ORDER — DIPHENHYDRAMINE HCL 50 MG/ML IJ SOLN
INTRAMUSCULAR | Status: AC
  Filled 2017-07-13: qty 1

## 2017-07-13 MED ORDER — FENTANYL CITRATE (PF) 100 MCG/2ML IJ SOLN
INTRAMUSCULAR | Status: DC | PRN
  Administered 2017-07-13: 100 ug via INTRAVENOUS
  Administered 2017-07-13 (×4): 50 ug via INTRAVENOUS

## 2017-07-13 MED ORDER — LACTATED RINGERS IR SOLN
Status: DC | PRN
  Administered 2017-07-13: 1000 mL

## 2017-07-13 MED ORDER — FENTANYL CITRATE (PF) 100 MCG/2ML IJ SOLN
25.0000 ug | INTRAMUSCULAR | Status: DC | PRN
  Administered 2017-07-13 (×2): 50 ug via INTRAVENOUS

## 2017-07-13 MED ORDER — ONDANSETRON HCL 4 MG/2ML IJ SOLN
4.0000 mg | Freq: Once | INTRAMUSCULAR | Status: DC | PRN
Start: 2017-07-13 — End: 2017-07-13

## 2017-07-13 MED ORDER — PREMIER PROTEIN SHAKE: 2.0000 [oz_av] | ORAL | Status: DC

## 2017-07-13 MED ORDER — LIDOCAINE 2% (20 MG/ML) 5 ML SYRINGE
INTRAMUSCULAR | Status: DC | PRN
  Administered 2017-07-13: 1.5 mg/kg/h via INTRAVENOUS

## 2017-07-13 MED ORDER — BUPIVACAINE LIPOSOME 1.3 % IJ SUSP
20.0000 mL | Freq: Once | INTRAMUSCULAR | Status: AC
  Administered 2017-07-13: 20 mL
  Filled 2017-07-13: qty 20

## 2017-07-13 MED ORDER — PROPOFOL 10 MG/ML IV BOLUS
INTRAVENOUS | Status: DC | PRN
  Administered 2017-07-13: 200 mg via INTRAVENOUS

## 2017-07-13 MED ORDER — CEFOTETAN DISODIUM-DEXTROSE 2-2.08 GM-%(50ML) IV SOLR
2.0000 g | INTRAVENOUS | Status: AC
  Administered 2017-07-13: 2 g via INTRAVENOUS
  Filled 2017-07-13: qty 50

## 2017-07-13 MED ORDER — EPHEDRINE SULFATE-NACL 50-0.9 MG/10ML-% IV SOSY
PREFILLED_SYRINGE | INTRAVENOUS | Status: DC | PRN
  Administered 2017-07-13 (×2): 5 mg via INTRAVENOUS

## 2017-07-13 MED ORDER — ONDANSETRON HCL 4 MG/2ML IJ SOLN
INTRAMUSCULAR | Status: DC | PRN
  Administered 2017-07-13: 4 mg via INTRAVENOUS

## 2017-07-13 MED ORDER — PROPRANOLOL HCL 80 MG PO TABS
80.0000 mg | ORAL_TABLET | Freq: Once | ORAL | Status: AC
  Administered 2017-07-13: 80 mg via ORAL
  Filled 2017-07-13: qty 1

## 2017-07-13 MED ORDER — ACETAMINOPHEN 160 MG/5ML PO SOLN
650.0000 mg | ORAL | Status: DC
  Administered 2017-07-13 – 2017-07-14 (×4): 650 mg via ORAL
  Filled 2017-07-13 (×4): qty 20.3

## 2017-07-13 MED ORDER — SODIUM CHLORIDE 0.9 % IJ SOLN
INTRAMUSCULAR | Status: DC | PRN
  Administered 2017-07-13: 50 mL

## 2017-07-13 MED ORDER — FENTANYL CITRATE (PF) 250 MCG/5ML IJ SOLN
INTRAMUSCULAR | Status: AC
  Filled 2017-07-13: qty 5

## 2017-07-13 SURGICAL SUPPLY — 62 items
APPLICATOR COTTON TIP 6IN STRL (MISCELLANEOUS) IMPLANT
APPLIER CLIP ROT 10 11.4 M/L (STAPLE)
APPLIER CLIP ROT 13.4 12 LRG (CLIP)
BANDAGE ADH SHEER 1  50/CT (GAUZE/BANDAGES/DRESSINGS) ×18 IMPLANT
BENZOIN TINCTURE PRP APPL 2/3 (GAUZE/BANDAGES/DRESSINGS) ×3 IMPLANT
BLADE SURG SZ11 CARB STEEL (BLADE) ×3 IMPLANT
CABLE HIGH FREQUENCY MONO STRZ (ELECTRODE) ×3 IMPLANT
CHLORAPREP W/TINT 26ML (MISCELLANEOUS) ×3 IMPLANT
CLIP APPLIE ROT 10 11.4 M/L (STAPLE) IMPLANT
CLIP APPLIE ROT 13.4 12 LRG (CLIP) IMPLANT
CLOSURE WOUND 1/2 X4 (GAUZE/BANDAGES/DRESSINGS) ×1
DECANTER SPIKE VIAL GLASS SM (MISCELLANEOUS) ×3 IMPLANT
DEVICE SUT QUICK LOAD TK 5 (STAPLE) IMPLANT
DEVICE SUT TI-KNOT TK 5X26 (MISCELLANEOUS) IMPLANT
DEVICE TI KNOT TK5 (MISCELLANEOUS)
DRAPE UTILITY XL STRL (DRAPES) ×6 IMPLANT
ELECT L-HOOK LAP 45CM DISP (ELECTROSURGICAL)
ELECT PENCIL ROCKER SW 15FT (MISCELLANEOUS) IMPLANT
ELECT REM PT RETURN 15FT ADLT (MISCELLANEOUS) ×3 IMPLANT
ELECTRODE L-HOOK LAP 45CM DISP (ELECTROSURGICAL) IMPLANT
GAUZE SPONGE 4X4 12PLY STRL (GAUZE/BANDAGES/DRESSINGS) IMPLANT
GLOVE BIO SURGEON STRL SZ7.5 (GLOVE) ×3 IMPLANT
GLOVE INDICATOR 8.0 STRL GRN (GLOVE) ×3 IMPLANT
GOWN STRL REUS W/TWL XL LVL3 (GOWN DISPOSABLE) ×12 IMPLANT
GRASPER SUT TROCAR 14GX15 (MISCELLANEOUS) ×3 IMPLANT
HOVERMATT SINGLE USE (MISCELLANEOUS) ×3 IMPLANT
KIT BASIN OR (CUSTOM PROCEDURE TRAY) ×3 IMPLANT
MARKER SKIN DUAL TIP RULER LAB (MISCELLANEOUS) ×3 IMPLANT
NEEDLE SPNL 22GX3.5 QUINCKE BK (NEEDLE) ×3 IMPLANT
PACK UNIVERSAL I (CUSTOM PROCEDURE TRAY) ×3 IMPLANT
QUICK LOAD TK 5 (STAPLE)
RELOAD STAPLER BLUE 60MM (STAPLE) ×3 IMPLANT
RELOAD STAPLER GOLD 60MM (STAPLE) ×1 IMPLANT
RELOAD STAPLER GREEN 60MM (STAPLE) ×2 IMPLANT
SCISSORS LAP 5X45 EPIX DISP (ENDOMECHANICALS) IMPLANT
SEALANT SURGICAL APPL DUAL CAN (MISCELLANEOUS) IMPLANT
SET IRRIG TUBING LAPAROSCOPIC (IRRIGATION / IRRIGATOR) ×3 IMPLANT
SHEARS HARMONIC ACE PLUS 45CM (MISCELLANEOUS) ×3 IMPLANT
SLEEVE GASTRECTOMY 40FR VISIGI (MISCELLANEOUS) ×3 IMPLANT
SLEEVE XCEL OPT CAN 5 100 (ENDOMECHANICALS) ×9 IMPLANT
SOLUTION ANTI FOG 6CC (MISCELLANEOUS) ×3 IMPLANT
SPONGE LAP 18X18 X RAY DECT (DISPOSABLE) ×3 IMPLANT
STAPLER ECHELON BIOABSB 60 FLE (MISCELLANEOUS) ×18 IMPLANT
STAPLER ECHELON LONG 60 440 (INSTRUMENTS) ×3 IMPLANT
STAPLER RELOAD BLUE 60MM (STAPLE) ×9
STAPLER RELOAD GOLD 60MM (STAPLE) ×3
STAPLER RELOAD GREEN 60MM (STAPLE) ×6
STRIP CLOSURE SKIN 1/2X4 (GAUZE/BANDAGES/DRESSINGS) ×2 IMPLANT
SUT MNCRL AB 4-0 PS2 18 (SUTURE) ×3 IMPLANT
SUT SURGIDAC NAB ES-9 0 48 120 (SUTURE) IMPLANT
SUT VICRYL 0 TIES 12 18 (SUTURE) ×3 IMPLANT
SYR 20CC LL (SYRINGE) ×3 IMPLANT
SYR 50ML LL SCALE MARK (SYRINGE) ×3 IMPLANT
TOWEL OR 17X26 10 PK STRL BLUE (TOWEL DISPOSABLE) ×3 IMPLANT
TOWEL OR NON WOVEN STRL DISP B (DISPOSABLE) ×3 IMPLANT
TRAY FOLEY W/METER SILVER 16FR (SET/KITS/TRAYS/PACK) IMPLANT
TROCAR BLADELESS 15MM (ENDOMECHANICALS) ×3 IMPLANT
TROCAR BLADELESS OPT 5 100 (ENDOMECHANICALS) ×3 IMPLANT
TUBING CONNECTING 10 (TUBING) ×4 IMPLANT
TUBING CONNECTING 10' (TUBING) ×2
TUBING ENDO SMARTCAP (MISCELLANEOUS) ×3 IMPLANT
TUBING INSUF HEATED (TUBING) ×3 IMPLANT

## 2017-07-13 NOTE — Transfer of Care (Signed)
Immediate Anesthesia Transfer of Care Note  Patient: Nicole [name]  Procedure(s) Performed: LAPAROSCOPIC GASTRIC SLEEVE RESECTION WITH UPPER ENDOSCOPY (N/A Abdomen)  Patient Location: PACU  Anesthesia Type:General  Level of Consciousness: oriented, sedated and patient cooperative  Airway & Oxygen Therapy: Patient Spontanous Breathing and Patient connected to face mask oxygen  Post-op Assessment: Report given to RN and Post -op Vital signs reviewed and stable  Post vital signs: Reviewed and stable  Last Vitals:  Vitals:   07/13/17 1138  BP: 125/74  Pulse: 73  Resp: 18  Temp: 36.6 C  SpO2: 98%    Last Pain:  Vitals:   07/13/17 1203  TempSrc:   PainSc: 6       Patients Stated Pain Goal: 4 (77/41/42 3953)  Complications: No apparent anesthesia complications

## 2017-07-13 NOTE — Op Note (Signed)
07/13/2017 Caryl Bis August 10, 1976 [id number]   PRE-OPERATIVE DIAGNOSIS:    Obesity, Class III, BMI 43   Essential hypertension   Chronic pain of multiple joints   GERD (gastroesophageal reflux disease)   Fibromyalgia  POST-OPERATIVE DIAGNOSIS:  same  PROCEDURE:  Procedure(s): LAPAROSCOPIC SLEEVE GASTRECTOMY  UPPER GI ENDOSCOPY  SURGEON:  Surgeon(s): [name], MD FACS FASMBS  ASSISTANTS: Gurney Maxin MD  ANESTHESIA:   general  DRAINS: none   BOUGIE: 40 fr ViSiGi  LOCAL MEDICATIONS USED:  MARCAINE + Exparel  SPECIMEN:  Source of Specimen:  Greater curvature of stomach  DISPOSITION OF SPECIMEN:  PATHOLOGY  COUNTS:  YES  INDICATION FOR PROCEDURE: This is a very pleasant 41 y.o.-year-old morbidly obese female who has had unsuccessful attempts for sustained weight loss. The patient presents today for a planned laparoscopic sleeve gastrectomy with upper endoscopy. We have discussed the risk and benefits of the procedure extensively preoperatively. Please see my separate notes.  PROCEDURE: After obtaining informed consent and receiving 5000 units of subcutaneous heparin, the patient was brought to the operating room at [facility] and placed supine on the operating room table. General endotracheal anesthesia was established. Sequential compression devices were placed. A orogastric tube was placed. The patient's abdomen was prepped and draped in the usual standard surgical fashion. The patient received preoperative IV antibiotic. A surgical timeout was performed.  Access to the abdomen was achieved using a 5 mm 0 laparoscope thru a 5 mm trocar In the left upper Quadrant 2 fingerbreadths below the left subcostal margin using the Optiview technique. Pneumoperitoneum was smoothly established up to 15 mm of mercury. The laparoscope was advanced and the abdominal cavity was surveilled. The patient was then placed in reverse Trendelenburg.   A 5 mm trocar was placed  slightly above and to the left of the umbilicus under direct visualization.  [facility] liver retractor was placed under the left lobe of the liver through a 5 mm trocar incision site in the subxiphoid position. A 5 mm trocar was placed in the lateral right upper quadrant along with a 15 mm trocar in the mid right abdomen. A final 5 mm trocar was placed in the lateral LUQ.  All under direct visualization. Exparel was infiltrated in bilateral lateral upper abdominal walls as a TAP block.  The stomach was inspected. It was completely decompressed and the orogastric tube was removed.  There was no anterior dimple that was obviously visible. Her preop UGI showed no hiatal hernia.   We identified the pylorus and measured 6 cm proximal to the pylorus and identified an area of where we would start taking down the short gastric vessels. Harmonic scalpel was used to take down the short gastric vessels along the greater curvature of the stomach. We were able to enter the lesser sac. We continued to march along the greater curvature of the stomach taking down the short gastrics. As we approached the gastrosplenic ligament we took care in this area not to injure the spleen. We were able to take down the entire gastrosplenic ligament. We then mobilized the fundus away from the left crus of diaphragm. There were a few significant posterior gastric avascular attachments which were taken down. This left the stomach completely mobilized. No vessels had been taken down along the lesser curvature of the stomach.  We then reidentified the pylorus. A 40Fr ViSiGi was then placed in the oropharynx and advanced down into the stomach and placed in the distal antrum and positioned  along the lesser curvature. It was placed under suction which secured the 40Fr ViSiGi in place along the lesser curve. Then using the Ethicon echelon 60 mm stapler with a green load with Seamguard, I placed a stapler along the antrum approximately 5 cm  from the pylorus. The stapler was angled so that there is ample room at the angularis incisura. I then fired the first staple load after inspecting it posteriorly to ensure adequate space both anteriorly and posteriorly. At this point I still was not completely past the angularis so with another green load with Seamguard, I placed the stapler in position just inside the prior stapleline. We then rotated the stomach to insure that there was adequate anteriorly as well as posteriorly. The stapler was then fired.  At this point I started using 60 mm gold load staple cartridge x1 with Seamguard. The echelon stapler was then repositioned with a 60 mm blue load with Seamguard and we continued to march up along the [place]. My assistant was holding traction along the greater curvature stomach along the cauterized short gastric vessels ensuring that the stomach was symmetrically retracted. Prior to each firing of the staple, we rotated the stomach to ensure that there is adequate stomach left.  As we approached the fundus, I used 60 mm blue cartridge with Seamguard aiming lateral to the GE junction after mobilizing the esophageal fat pad. Although the staples on this fire had completely gone thru the last part of the stomach it had not completely cut it. Therefore 1 additional 60 blue load was used to free the remaining stomach. The sleeve was inspected. There is no evidence of cork screw. The staple line appeared hemostatic. The CRNA inflated the ViSiGi to the green zone and the upper abdomen was flooded with saline. There were no bubbles. The sleeve was decompressed and the ViSiGi removed. My assistant scrubbed out and performed an upper endoscopy. The sleeve easily distended with air and the scope was easily advanced to the pylorus. There is no evidence of internal bleeding or cork screwing. There was no narrowing at the angularis. There is no evidence of bubbles. Please see his operative note for further details. The  gastric sleeve was decompressed and the endoscope was removed.  The greater curvature the stomach was grasped with a laparoscopic grasper and removed from the 15 mm trocar site.  The liver retractor was removed. I then closed the 15 mm trocar site with 1 interrupted 0 Vicryl sutures through the fascia using the endoclose. The closure was viewed laparoscopically and it was airtight. Remaining Exparel was then infiltrated in the preperitoneal spaces around the trocar sites. Pneumoperitoneum was released. All trocar sites were closed with a 4-0 Monocryl in a subcuticular fashion followed by the application of benzoin, steri-strips and bandages. The patient was extubated and taken to the recovery room in stable condition. All needle, instrument, and sponge counts were correct x2. There are no immediate complications  (2) 60 mm green with Seamguard (1) 60 mm gold with seamguard (3) 60 mm blue with 2 seamguard  PLAN OF CARE: Admit to inpatient   PATIENT DISPOSITION:  PACU - hemodynamically stable.   Delay start of Pharmacological VTE agent (>24hrs) due to surgical blood loss or risk of bleeding:  no  Leighton Ruff. Redmond Pulling, MD, FACS FASMBS General, Bariatric, & Minimally Invasive Surgery [facility], [place]

## 2017-07-13 NOTE — Progress Notes (Signed)
Discussed post op day goals with patient including ambulation, IS, diet progression, pain, and nausea control.  Ambulated from PACU stretcher to bathroom without difficulty.  Voided 100 cc urine.  Questions answered.

## 2017-07-13 NOTE — Anesthesia Procedure Notes (Signed)
Procedure Name: Intubation Date/Time: 07/13/2017 1:26 PM Performed by: Montel Clock Pre-anesthesia Checklist: Patient identified, Emergency Drugs available, Suction available, Patient being monitored and Timeout performed Patient Re-evaluated:Patient Re-evaluated prior to induction Oxygen Delivery Method: Circle system utilized Preoxygenation: Pre-oxygenation with 100% oxygen Induction Type: IV induction Ventilation: Mask ventilation without difficulty Laryngoscope Size: Mac and 3 Grade View: Grade II Tube type: Oral Tube size: 7.5 mm Number of attempts: 1 Airway Equipment and Method: Stylet Placement Confirmation: ETT inserted through vocal cords under direct vision,  positive ETCO2 and breath sounds checked- equal and bilateral Secured at: 21 cm Tube secured with: Tape Dental Injury: Teeth and Oropharynx as per pre-operative assessment  Comments: Downward laryngeal pressure required for Grade 2b view. ETT easily passed.

## 2017-07-13 NOTE — Anesthesia Preprocedure Evaluation (Addendum)
Anesthesia Evaluation  Patient identified by MRN, date of birth, ID band Patient awake    Reviewed: Allergy & Precautions, NPO status , Patient's Chart, lab work & pertinent test results, reviewed documented beta blocker date and time   History of Anesthesia Complications (+) PONV  Airway Mallampati: II  TM Distance: >3 FB Neck ROM: Full    Dental  (+) Dental Advisory Given   Pulmonary COPD, former smoker,    breath sounds clear to auscultation       Cardiovascular hypertension, Pt. on medications and Pt. on home beta blockers  Rhythm:Regular Rate:Normal     Neuro/Psych  Neuromuscular disease    GI/Hepatic Neg liver ROS, GERD  ,  Endo/Other  negative endocrine ROS  Renal/GU negative Renal ROS     Musculoskeletal   Abdominal   Peds  Hematology  (+) anemia ,   Anesthesia Other Findings   Reproductive/Obstetrics                            Lab Results  Component Value Date   WBC 8.0 07/07/2017   HGB 12.6 07/07/2017   HCT 39.2 07/07/2017   MCV 81.3 07/07/2017   PLT 361 07/07/2017   Lab Results  Component Value Date   CREATININE 0.89 07/07/2017   BUN 16 07/07/2017   NA 139 07/07/2017   K 4.3 07/07/2017   CL 103 07/07/2017   CO2 28 07/07/2017    Anesthesia Physical Anesthesia Plan  ASA: III  Anesthesia Plan: General   Post-op Pain Management:    Induction: Intravenous  PONV Risk Score and Plan: 4 or greater and Ondansetron, Dexamethasone, Scopolamine patch - Pre-op, Treatment may vary due to age or medical condition and Midazolam  Airway Management Planned: Oral ETT  Additional Equipment: None  Intra-op Plan:   Post-operative Plan: Extubation in OR  Informed Consent: I have reviewed the patients History and Physical, chart, labs and discussed the procedure including the risks, benefits and alternatives for the proposed anesthesia with the patient or authorized  representative who has indicated his/her understanding and acceptance.   Dental advisory given  Plan Discussed with: CRNA  Anesthesia Plan Comments:        Anesthesia Quick Evaluation

## 2017-07-13 NOTE — Anesthesia Postprocedure Evaluation (Signed)
Anesthesia Post Note  Patient: Nicole [name]  Procedure(s) Performed: LAPAROSCOPIC GASTRIC SLEEVE RESECTION WITH UPPER ENDOSCOPY (N/A Abdomen)     Patient location during evaluation: PACU Anesthesia Type: General Level of consciousness: awake and alert Pain management: pain level controlled Vital Signs Assessment: post-procedure vital signs reviewed and stable Respiratory status: spontaneous breathing, nonlabored ventilation, respiratory function stable and patient connected to nasal cannula oxygen Cardiovascular status: blood pressure returned to baseline and stable Postop Assessment: no apparent nausea or vomiting Anesthetic complications: no    Last Vitals:  Vitals:   07/13/17 1700 07/13/17 1715  BP: 112/80 129/79  Pulse: 72 70  Resp: 10 12  Temp:  36.4 C  SpO2: 99% 96%    Last Pain:  Vitals:   07/13/17 1700  TempSrc:   PainSc: 9                  Tiajuana Amass

## 2017-07-13 NOTE — Discharge Instructions (Signed)
° ° ° °GASTRIC BYPASS/SLEEVE ° Home Care Instructions ° ° These instructions are to help you care for yourself when you go home. ° °Call: If you have any problems. °• Call [phone] and ask for the surgeon on call °• If you need immediate assistance come to the ER at [facility]. Tell the ER staff you are a new post-op gastric bypass or gastric sleeve patient  °Signs and symptoms to report: • Severe  vomiting or nausea °o If you cannot handle clear liquids for longer than 1 day, call your surgeon °• Abdominal pain which does not get better after taking your pain medication °• Fever greater than 100.4°  F and chills °• Heart rate over 100 beats a minute °• Trouble breathing °• Chest pain °• Redness,  swelling, drainage, or foul odor at incision (surgical) sites °• If your incisions open or pull apart °• Swelling or pain in calf (lower leg) °• Diarrhea (Loose bowel movements that happen often), frequent watery, uncontrolled bowel movements °• Constipation, (no bowel movements for 3 days) if this happens: °o Take Milk of Magnesia, 2 tablespoons by mouth, 3 times a day for 2 days if needed °o Stop taking Milk of Magnesia once you have had a bowel movement °o Call your doctor if constipation continues °Or °o Take Miralax  (instead of Milk of Magnesia) following the label instructions °o Stop taking Miralax once you have had a bowel movement °o Call your doctor if constipation continues °• Anything you think is “abnormal for you” °  °Normal side effects after surgery: • Unable to sleep at night or unable to concentrate °• Irritability °• Being tearful (crying) or depressed ° °These are common complaints, possibly related to your anesthesia, stress of surgery, and change in lifestyle, that usually go away a few weeks after surgery. If these feelings continue, call your medical doctor.  °Wound Care: You may have surgical glue, steri-strips, or staples over your incisions after surgery °• Surgical glue: Looks like clear  film over your incisions and will wear off a little at a time °• Steri-strips: Adhesive strips of tape over your incisions. You may notice a yellowish color on skin under the steri-strips. This is used to make the steri-strips stick better. Do not pull the steri-strips off - let them fall off °• Staples: Staples may be removed before you leave the hospital °o If you go home with staples, call [facility] for an appointment with your surgeon’s nurse to have staples removed 10 days after surgery, [phone] °• Showering: You may shower two (2) days after your surgery unless your surgeon tells you differently °o Wash gently around incisions with warm soapy water, rinse well, and gently pat dry °o If you have a drain (tube from your incision), you may need someone to hold this while you shower °o No tub baths until staples are removed and incisions are healed °  °Medications: • Medications should be liquid or crushed if larger than the size of a dime °• Extended release pills (medication that releases a little bit at a time through the  day) should not be crushed °• Depending on the size and number of medications you take, you may need to space (take a few throughout the day)/change the time you take your medications so that you do not over-fill your pouch (smaller stomach) °• Make sure you follow-up with you primary care physician to make medication changes needed during rapid weight loss and life -style changes °•   If you have diabetes, follow up with your doctor that orders your diabetes medication(s) within one week after surgery and check your blood sugar regularly ° °• Do not drive while taking narcotics (pain medications) ° °• Do not take acetaminophen (Tylenol) and Roxicet or Lortab Elixir at the same time since these pain medications contain acetaminophen °  °Diet:  °First 2 Weeks You will see the nutritionist about two (2) weeks after your surgery. The nutritionist will increase the types of  foods you can eat if you are handling liquids well: °• If you have severe vomiting or nausea and cannot handle clear liquids lasting longer than 1 day call your surgeon °Protein Shake °• Drink at least 2 ounces of shake 5-6 times per day °• Each serving of protein shakes (usually 8-12 ounces) should have a minimum of: °o 15 grams of protein °o And no more than 5 grams of carbohydrate °• Goal for protein each day: °o Men = 80 grams per day °o Women = 60 grams per day °  ° • Protein powder may be added to fluids such as non-fat milk or Lactaid milk or Soy milk (limit to 35 grams added protein powder per serving) ° °Hydration °• Slowly increase the amount of water and other clear liquids as tolerated (See Acceptable Fluids) °• Slowly increase the amount of protein shake as tolerated °• Sip fluids slowly and throughout the day °• May use sugar substitutes in small amounts (no more than 6-8 packets per day; i.e. Splenda) ° °Fluid Goal °• The first goal is to drink at least 8 ounces of protein shake/drink per day (or as directed by the nutritionist); some examples of protein shakes are Syntrax Nectar, Adkins Advantage, EAS Edge HP, and Unjury. - See handout from pre-op Bariatric Education Class: °o Slowly increase the amount of protein shake you drink as tolerated °o You may find it easier to slowly sip shakes throughout the day °o It is important to get your proteins in first °• Your fluid goal is to drink 64-100 ounces of fluid daily °o It may take a few weeks to build up to this  °• 32 oz. (or more) should be clear liquids °And °• 32 oz. (or more) should be full liquids (see below for examples) °• Liquids should not contain sugar, caffeine, or carbonation ° °Clear Liquids: °• Water of Sugar-free flavored water (i.e. Fruit H²O, Propel) °• Decaffeinated coffee or tea (sugar-free) °• Crystal lite, Wyler’s Lite, Minute Maid Lite °• Sugar-free Jell-O °• Bouillon or broth °• Sugar-free Popsicle:    - Less than 20 calories  each; Limit 1 per day ° °Full Liquids: °                  Protein Shakes/Drinks + 2 choices per day of other full liquids °• Full liquids must be: °o No More Than 12 grams of Carbs per serving °o No More Than 3 grams of Fat per serving °• Strained low-fat cream soup °• Non-Fat milk °• Fat-free Lactaid Milk °• Sugar-free yogurt (Dannon Lite & Fit, Greek yogurt) ° °  °Vitamins and Minerals • Start 1 day after surgery unless otherwise directed by your surgeon °• 2 Chewable Bariatric Multivitamin / Multimineral Supplement with iron °• Chewable Calcium Citrate with Vitamin D-3 °(Example: 3 Chewable Calcium  Plus 600 with Vitamin D-3) °o Take 500 mg three (3) times a day for a total of 1500 mg each day °o Do not take all 3 doses of calcium   at one time as it may cause constipation, and you can only absorb 500 mg at a time °o Do not mix multivitamins containing iron with calcium supplements;  take 2 hours apart °• Menstruating women and those at risk for anemia ( a blood disease that causes weakness) may need extra iron °o Talk to your doctor to see if you need more iron °• If you need extra iron: Total daily Iron recommendation (including Vitamins) is 50 to 100 mg Iron/day °• Do not stop taking or change any vitamins or minerals until you talk to your nutritionist or surgeon °• Your nutritionist and/or surgeon must approve all vitamin and mineral supplements °  °Activity and Exercise: It is important to continue walking at home. Limit your physical activity as instructed by your doctor. During this time, use these guidelines: °• Do not lift anything greater than ten  (10) pounds for at least two (2) weeks °• Do not go back to work or drive until your surgeon says you can °• You may have sex when you feel comfortable °o It is VERY important for female patients to use a reliable birth control method; fertility often increase after surgery °o Do not get pregnant for at least 18 months °• Start exercising as soon as your  doctor tells you that you can °o Make sure your doctor approves any physical activity °• Start with a simple walking program °• Walk 5-15 minutes each day, 7 days per week °• Slowly increase until you are walking 30-45 minutes per day °• Consider joining our BELT program. [phone] or email [email or web address] °  °Special Instructions Things to remember: °• Use your CPAP when sleeping if this applies to you °• Consider buying a medical alert bracelet that says you had lap-band surgery °  °  You will likely have your first fill (fluid added to your band) 6 - 8 weeks after surgery °• [facility] has a free Bariatric Surgery Support Group that meets monthly, the 3rd Thursday, 6pm. [facility] Classrooms. You can see classes online at [email or web address] °• It is very important to keep all follow up appointments with your surgeon, nutritionist, primary care physician, and behavioral health practitioner °o After the first year, please follow up with your bariatric surgeon and nutritionist at least once a year in order to maintain best weight loss results °      °             [facility]:  [phone] ° °             [facility]: [phone] ° °             Bariatric Nurse Coordinator: [phone]  °Gastric Bypass/Sleeve Home Care Instructions  Rev. 10/2012    ° °                                                    Reviewed and Endorsed °                                                   by [facility] Patient Education Committee, Jan, 2014 ° ° ° ° ° ° ° ° ° °

## 2017-07-13 NOTE — Interval H&P Note (Signed)
History and Physical Interval Note:  07/13/2017 12:56 PM  [name]  has presented today for surgery, with the diagnosis of MORBID OBESITY  The various methods of treatment have been discussed with the patient and family. After consideration of risks, benefits and other options for treatment, the patient has consented to  Procedure(s): LAPAROSCOPIC GASTRIC SLEEVE RESECTION (N/A) as a surgical intervention .  The patient's history has been reviewed, patient examined, no change in status, stable for surgery.  I have reviewed the patient's chart and labs.  Questions were answered to the patient's satisfaction.    Leighton Ruff. Redmond Pulling, MD, [place], Bariatric, & Minimally Invasive Surgery [facility], [place]  [facility] M

## 2017-07-13 NOTE — Op Note (Signed)
Preoperative diagnosis: laparoscopic sleeve gastrectomy  Postoperative diagnosis: Same   Procedure: Upper endoscopy   Surgeon: Yanelie Abraha, M.D.  Anesthesia: Gen.   Indications for procedure: This patient was undergoing a laparoscopic sleeve gastrectomy.   Description of procedure: The endoscopy was placed in the mouth and into the oropharynx and under endoscopic vision it was advanced to the esophagogastric junction. The pouch was insufflated and no bleeding or bubbles were seen. The GEJ was identified at 40cm from the teeth.  No bleeding or leaks were detected. The scope was withdrawn without difficulty.   Sherrise Liberto, M.D. General, Bariatric, & Minimally Invasive Surgery [facility] [place]    

## 2017-07-13 NOTE — H&P (View-Only) (Signed)
Nicole [name] 07/02/2017 3:47 PM Location: [facility] Patient #: [id number] DOB: 04-24-1976 Married / Language: Nicole [name] / Race: White Female  History of Present Illness Nicole Hiss M. Bartolo Montanye MD; 07/02/2017 6:20 PM) The patient is a 41 year old female who presents for a bariatric surgery evaluation. She comes in today for her preoperative appointment. She is in the process of being approved for laparoscopic sleeve gastrectomy by the insurance company. I initially met her about 6 weeks ago. She denies any medical changes since her initial visit. She denies any trips the emergency room or hospital. Her upper GI was unremarkable. Her EKG was unremarkable. Chest x-ray was within normal limits. She had had most of her bariatric evaluation labs done in May 2018. A metabolic panel and CBC were within normal limits. An essentially normal lipid panel. Labs that we check revealed an iron level of 26 which is low. and prediabetes with the A1c of 6.0. Her sleep study from 3 years ago did show no evidence of significant sleep apnea.  A comprehensive 12 point review of systems was performed and all systems are negative except for what is mentioned in the HPI  05/20/2017 She is referred by Nicole Poet PA-C for evaluation of weight loss surgery. She completed our Neurosurgeon. She also attended another programs seminar in person. She is interested in the sleeve gastrectomy. She states that she wants to take control of her life again. Despite numerous attempts were sustained weight loss she has been unsuccessful. She has tried [name], [name], [name], [name], biggest loser contest, [name], and phentermine-all without any long-term success.  Her comorbidities include hypertension multiple joint pain, fibromyalgia, GERD  She denies any chest pain, chest pressure, source of breath, orthopnea, paroxysmal nocturnal dyspnea, TIAs or amaurosis fugax. She denies any personal  history of blood clots. She occasionally may have some dyspnea on exertion. She states that she had a normal sleep study in 2015. She use to be a heavy smoker but quit in 2016. She takes medicine for heartburn. She states that she does not take her medication she will have burning in her esophagus and stomach. She denies any nausea, vomiting, melena or hematochezia. She states that she has IBS alternating between diarrhea and constipation. She denies any history of Crohn's or ulcerative colitis. She has irregular menses. She has had a tubal ligation. She has a history kidney stones. She has a stimulator for a week the bladder. She has joint pain mainly in her hands and elbows. She denies any polyuria or polydipsia. She has occasional migraines only when she takes phentermine. She denies any current tobacco usage. She drinks alcohol on occasional basis. She denies any drug use.   Problem List/Past Medical Nicole Hiss M. Redmond Pulling, MD; 07/02/2017 6:22 PM) GERD WITHOUT ESOPHAGITIS (K21.9) FIBROMYALGIA (M79.7) OBESITY, MORBID, BMI 40.0-49.9 (E66.01)  Past Surgical History Nicole Hiss M. Redmond Pulling, MD; 07/02/2017 6:22 PM) Breast Augmentation Bilateral. Knee Surgery Left. Oral Surgery Spinal Surgery - Neck  Diagnostic Studies History Nicole Hiss M. Redmond Pulling, MD; 07/02/2017 6:22 PM) Colonoscopy 5-10 years ago Mammogram 1-3 years ago Pap Smear 1-5 years ago  Allergies Nicole [name], [name]; 07/02/2017 3:47 PM) Codeine Phosphate *ANALGESICS - OPIOID* Itching, Nausea. Allergies Reconciled  Medication History Nicole Hiss M. Redmond Pulling, MD; 07/02/2017 6:22 PM) Nicole [name] ('350MG'$  Tablet, Oral) Active. Escitalopram Oxalate ('10MG'$  Tablet, Oral) Active. Omeprazole ('40MG'$  Capsule DR, Oral) Active. Propranolol HCl ('80MG'$  Tablet, Oral) Active. Melatonin ER ('10MG'$  Tablet ER, Oral) Active. Prenatal Vitamin (Oral) Active. Magnesium ('500MG'$  Tablet, Oral) Active.  Probiotic Daily (Oral) Active. Calcium (Oral)  Specific strength unknown - Active. Medications Reconciled OxyCODONE HCl ('5MG'$ /5ML Solution, 5-10 Milliliter Oral every four hours, as needed, Taken starting 07/02/2017) Active. Zofran ODT ('4MG'$  Tablet Disint, 1 (one) Tablet Oral every six hours, as needed, Taken starting 07/02/2017) Active. (As needed for nasuea)  Social History Nicole Hiss M. Redmond Pulling, MD; 07/02/2017 6:22 PM) Alcohol use Occasional alcohol use. Caffeine use Carbonated beverages, Coffee. No drug use Tobacco use Former smoker.  Family History Nicole Hiss M. Redmond Pulling, MD; 07/02/2017 6:22 PM) Alcohol Abuse Brother, Family Members In General, Father, Mother. Breast Cancer Mother. Colon Cancer Father. Heart disease in female family member before age 21 Heart disease in female family member before age 81 Hypertension Father. Migraine Headache Daughter. Prostate Cancer Father.  Pregnancy / Birth History Nicole Hiss M. Redmond Pulling, MD; 07/02/2017 6:22 PM) Age at menarche 89 years. Contraceptive History Depo-provera, Intrauterine device, Oral contraceptives. Gravida 4 Irregular periods Length (months) of breastfeeding 7-12 Maternal age 43-20 Para 4  Other Problems Nicole Hiss M. Redmond Pulling, MD; 07/02/2017 6:22 PM) Arthritis Back Pain Bladder Problems Cervical Cancer Chronic Obstructive Lung Disease Depression Gastroesophageal Reflux Disease Hemorrhoids Hypercholesterolemia Kidney Stone Migraine Headache DEPRESSION WITH ANXIETY (F41.8) PREDIABETES (R73.03) HYPERTENSION, ESSENTIAL (I10)    Vitals Nicole [name]; 07/02/2017 3:51 PM) 07/02/2017 3:51 PM Weight: 221 lb Height: 60in Body Surface Area: 1.95 m Body Mass Index: 43.16 kg/m  Temp.: 98.69F  Pulse: 93 (Regular)  BP: 124/80 (Sitting, Left Arm, Standard)      Physical Exam Nicole Hiss M. Stephaney Steven MD; 07/02/2017 6:17 PM)  General Mental Status-Alert. General Appearance-Consistent with stated age. Hydration-Well  hydrated. Voice-Normal.  Head and Neck Head-normocephalic, atraumatic with no lesions or palpable masses. Trachea-midline. Thyroid Gland Characteristics - normal size and consistency.  Eye Eyeball - Bilateral-Extraocular movements intact. Sclera/Conjunctiva - Bilateral-No scleral icterus.  ENMT Ears -Note:normal ext ears.  Mouth and Throat -Note:lips intact.   Chest and Lung Exam Chest and lung exam reveals -quiet, even and easy respiratory effort with no use of accessory muscles and on auscultation, normal breath sounds, no adventitious sounds and normal vocal resonance. Inspection Chest Wall - Normal. Back - normal.  Breast - Did not examine.  Cardiovascular Cardiovascular examination reveals -normal heart sounds, regular rate and rhythm with no murmurs and normal pedal pulses bilaterally.  Abdomen Inspection Inspection of the abdomen reveals - No Hernias. Skin - Scar - no surgical scars. Palpation/Percussion Palpation and Percussion of the abdomen reveal - Soft, Non Tender, No Rebound tenderness, No Rigidity (guarding) and No hepatosplenomegaly. Auscultation Auscultation of the abdomen reveals - Bowel sounds normal.  Peripheral Vascular Upper Extremity Palpation - Pulses bilaterally normal.  Neurologic Neurologic evaluation reveals -alert and oriented x 3 with no impairment of recent or remote memory. Mental Status-Normal.  Neuropsychiatric The patient's mood and affect are described as -normal. Judgment and Insight-insight is appropriate concerning matters relevant to self.  Musculoskeletal Normal Exam - Left-Upper Extremity Strength Normal and Lower Extremity Strength Normal. Normal Exam - Right-Upper Extremity Strength Normal and Lower Extremity Strength Normal. Note: bilateral knee crepitus  Lymphatic Head & Neck  General Head & Neck Lymphatics: Bilateral - Description - Normal. Axillary - Did not examine. Femoral &  Inguinal - Did not examine.    Assessment & Plan Nicole Hiss M. Keyon Liller MD; 07/02/2017 6:22 PM)  OBESITY, MORBID, BMI 40.0-49.9 (E66.01) Impression: We reviewed her preoperative workup. We discussed the upper GI, chest x-ray, and blood work. We discussed the preoperative diet plan. We discussed the importance of staying hydrated the  day before surgery. She was given her postoperative prescriptions today. We discussed the importance of staying active between now and surgery. We rediscussed the typical hospital as well as postoperative course. She has attended preoperative class. All of her questions were asked and answered.  Current Plans Pt Education - EMW_preopbariatric Started OxyCODONE HCl '5MG'$ /5ML, 5-10 Milliliter every four hours, as needed, 100 Milliliter, 07/02/2017, No Refill. Started Zofran ODT '4MG'$ , 1 (one) Tablet every six hours, as needed, #15, 07/02/2017, No Refill. Local Order: As needed for nasuea FIBROMYALGIA (M79.7)  GERD WITHOUT ESOPHAGITIS (K21.9) Impression: We did discuss that heartburn-reflux may worsen after a sleeve gastrectomy.  DEPRESSION WITH ANXIETY (F41.8)  HYPERTENSION, ESSENTIAL (I10)  PREDIABETES (R73.03)  Leighton Ruff. Redmond Pulling, MD, FACS General, Bariatric, & Minimally Invasive Surgery [facility], [place]

## 2017-07-14 LAB — COMPREHENSIVE METABOLIC PANEL
ALBUMIN: 3.4 g/dL — AB (ref 3.5–5.0)
ALK PHOS: 48 U/L (ref 38–126)
ALT: 28 U/L (ref 14–54)
ANION GAP: 9 (ref 5–15)
AST: 26 U/L (ref 15–41)
BUN: 12 mg/dL (ref 6–20)
CALCIUM: 8.5 mg/dL — AB (ref 8.9–10.3)
CO2: 26 mmol/L (ref 22–32)
Chloride: 105 mmol/L (ref 101–111)
Creatinine, Ser: 0.78 mg/dL (ref 0.44–1.00)
GFR calc Af Amer: >60 mL/min (ref >60)
GFR calc non Af Amer: >60 mL/min (ref >60)
GLUCOSE: 115 mg/dL — AB (ref 65–99)
POTASSIUM: 4 mmol/L (ref 3.5–5.1)
SODIUM: 140 mmol/L (ref 135–145)
Total Bilirubin: 0.4 mg/dL (ref 0.3–1.2)
Total Protein: 6.7 g/dL (ref 6.5–8.1)

## 2017-07-14 LAB — CBC WITH DIFFERENTIAL/PLATELET
BASOS PCT: 0 %
Basophils Absolute: 0 10*3/uL (ref 0.0–0.1)
EOS ABS: 0 10*3/uL (ref 0.0–0.7)
Eosinophils Relative: 0 %
HCT: 35.2 % — ABNORMAL LOW (ref 36.0–46.0)
HEMOGLOBIN: 11.4 g/dL — AB (ref 12.0–15.0)
Lymphocytes Relative: 17 %
Lymphs Abs: 2.2 10*3/uL (ref 0.7–4.0)
MCH: 26.3 pg (ref 26.0–34.0)
MCHC: 32.4 g/dL (ref 30.0–36.0)
MCV: 81.3 fL (ref 78.0–100.0)
MONOS PCT: 7 %
Monocytes Absolute: 0.9 10*3/uL (ref 0.1–1.0)
NEUTROS PCT: 76 %
Neutro Abs: 9.4 10*3/uL — ABNORMAL HIGH (ref 1.7–7.7)
Platelets: 380 10*3/uL (ref 150–400)
RBC: 4.33 MIL/uL (ref 3.87–5.11)
RDW: 14.9 % (ref 11.5–15.5)
WBC: 12.5 10*3/uL — AB (ref 4.0–10.5)

## 2017-07-14 MED ORDER — OXYCODONE HCL 5 MG/5ML PO SOLN: 5.0000 mg | ORAL | 0 refills | Status: DC | PRN

## 2017-07-14 MED ORDER — METOCLOPRAMIDE HCL 10 MG/10ML PO SOLN
10.0000 mg | Freq: Once | ORAL | Status: AC
  Administered 2017-07-14: 10 mg via ORAL
  Filled 2017-07-14: qty 10

## 2017-07-14 MED ORDER — KETOROLAC TROMETHAMINE 30 MG/ML IJ SOLN
30.0000 mg | Freq: Once | INTRAMUSCULAR | Status: AC
  Administered 2017-07-14: 30 mg via INTRAVENOUS
  Filled 2017-07-14: qty 1

## 2017-07-14 MED ORDER — DIPHENHYDRAMINE HCL 12.5 MG/5ML PO ELIX
25.0000 mg | ORAL_SOLUTION | Freq: Once | ORAL | Status: AC
  Administered 2017-07-14: 25 mg via ORAL
  Filled 2017-07-14: qty 10

## 2017-07-14 MED FILL — oxyCODONE HCL 5 MG/5ML SOLN: 5 | 2 days supply | Qty: 100 | Fill #0

## 2017-07-14 NOTE — Plan of Care (Signed)
Problem: Food- and Nutrition-Related Knowledge Deficit (NB-1.1) Goal: Nutrition education Formal process to instruct or train a patient/client in a skill or to impart knowledge to help patients/clients voluntarily manage or modify food choices and eating behavior to maintain or improve health. Outcome: Completed/Met Date Met: 07/14/17 Nutrition Education Note  Received consult for diet education per DROP protocol.   Discussed 2 week post op diet with pt. Emphasized that liquids must be non carbonated, non caffeinated, and sugar free. Fluid goals discussed. Pt to follow up with outpatient bariatric RD for further diet progression after 2 weeks. Multivitamins and minerals also reviewed. Teach back method used, pt expressed understanding, expect good compliance.   Diet: First 2 Weeks  You will see the nutritionist about two (2) weeks after your surgery. The nutritionist will increase the types of foods you can eat if you are handling liquids well:  If you have severe vomiting or nausea and cannot handle clear liquids lasting longer than 1 day, call your surgeon  Protein Shake  Drink at least 2 ounces of shake 5-6 times per day  Each serving of protein shakes (usually 8 - 12 ounces) should have a minimum of:  15 grams of protein  And no more than 5 grams of carbohydrate  Goal for protein each day:  Men = 80 grams per day  Women = 60 grams per day  Protein powder may be added to fluids such as non-fat milk or Lactaid milk or Soy milk (limit to 35 grams added protein powder per serving)   Hydration  Slowly increase the amount of water and other clear liquids as tolerated (See Acceptable Fluids)  Slowly increase the amount of protein shake as tolerated  Sip fluids slowly and throughout the day  May use sugar substitutes in small amounts (no more than 6 - 8 packets per day; i.e. Splenda)   Fluid Goal  The first goal is to drink at least 8 ounces of protein shake/drink per day (or as directed  by the nutritionist); some examples of protein shakes are Premier Protein, Syntrax Nectar, Adkins Advantage, EAS Edge HP, and Unjury. See handout from pre-op Bariatric Education Class:  Slowly increase the amount of protein shake you drink as tolerated  You may find it easier to slowly sip shakes throughout the day  It is important to get your proteins in first  Your fluid goal is to drink 64 - 100 ounces of fluid daily  It may take a few weeks to build up to this  32 oz (or more) should be clear liquids  And  32 oz (or more) should be full liquids (see below for examples)  Liquids should not contain sugar, caffeine, or carbonation   Clear Liquids:  Water or Sugar-free flavored water (i.e. Fruit H2O, Propel)  Decaffeinated coffee or tea (sugar-free)  Crystal Lite, Wyler's Lite, Minute Maid Lite  Sugar-free Jell-O  Bouillon or broth  Sugar-free Popsicle: *Less than 20 calories each; Limit 1 per day   Full Liquids:  Protein Shakes/Drinks + 2 choices per day of other full liquids  Full liquids must be:  No More Than 12 grams of Carbs per serving  No More Than 3 grams of Fat per serving  Strained low-fat cream soup  Non-Fat milk  Fat-free Lactaid Milk  Sugar-free yogurt (Dannon Lite & Fit, Greek yogurt, Oikos Zero)   Erilyn Pearman, MS, RD, LDN [facility] Dietitian Pager: [phone] After Hours Pager: [phone]     

## 2017-07-14 NOTE — Progress Notes (Signed)
Patient is reporting a "severe migraine" and requesting a migraine cocktail. Dr Redmond Pulling paged. Donne Hazel, RN

## 2017-07-14 NOTE — Progress Notes (Signed)
Patient alert and oriented, Post op day 1.  Provided support and encouragement.  Encouraged pulmonary toilet, ambulation and small sips of liquids.  Completed 12 ounces of fluid and 4 ounces of protein shake.  All questions answered.  Will continue to monitor.

## 2017-07-14 NOTE — Progress Notes (Signed)
Patient ID: Nicole [name], female   DOB: 24-Feb-1976, 41 y.o.   MRN: [id number]   Progress Note: Metabolic and Bariatric Surgery Service   Chief Complaint/Subjective: Walked multiple times last night this morning; tolerated water.  Just started shake, no nausea.  Some gas pain is her biggest complaint  Objective: Vital signs in last 24 hours: Temp:  [97.5 F (36.4 C)-98.3 F (36.8 C)] 98 F (36.7 C) (10/30 1027) Pulse Rate:  [70-86] 86 (10/30 1027) Resp:  [9-27] 16 (10/30 1027) BP: (112-144)/(59-86) 127/59 (10/30 1027) SpO2:  [95 %-100 %] 98 % (10/30 1027) Weight:  [99.3 kg (219 lb)] 99.3 kg (219 lb) (10/29 1227) Last BM Date: 07/13/17  Intake/Output from previous day: 10/29 0701 - 10/30 0700 In: 3486.3 [P.O.:480; I.V.:3006.3] Out: 2010 [Urine:2000; Blood:10] Intake/Output this shift: Total I/O In: 120 [P.O.:120] Out: 900 [Urine:900]  Lungs: cta  Cardiovascular: reg  Abd: soft, obese, mild approp TTP, incisions ok  Extremities: no edema, +SCD  Neuro: alert, ox3, nonfocal, looks good  Lab Results: CBC   Recent Labs  07/13/17 1814 07/14/17 0543  WBC  --  12.5*  HGB 12.9 11.4*  HCT 38.8 35.2*  PLT  --  380   BMET  Recent Labs  07/14/17 0543  NA 140  K 4.0  CL 105  CO2 26  GLUCOSE 115*  BUN 12  CREATININE 0.78  CALCIUM 8.5*   PT/INR No results for input(s): LABPROT, INR in the last 72 hours. ABG No results for input(s): PHART, HCO3 in the last 72 hours.  Invalid input(s): PCO2, PO2  Studies/Results:  Anti-infectives: Anti-infectives    Start     Dose/Rate Route Frequency Ordered Stop   07/13/17 1135  cefoTEtan in Dextrose 5% (CEFOTAN) IVPB 2 g     2 g Intravenous On call to O.R. 07/13/17 1135 07/13/17 1332      Medications: Scheduled Meds: . acetaminophen (TYLENOL) oral liquid 160 mg/5 mL  650 mg Oral Q4H  . enoxaparin (LOVENOX) injection  30 mg Subcutaneous Q12H  . metoprolol tartrate  5 mg Intravenous Q6H  . pantoprazole (PROTONIX) IV   40 mg Intravenous QHS  . [START ON 07/15/2017] protein supplement shake  2 oz Oral Q2H   Continuous Infusions: . dextrose 5 % and 0.45 % NaCl with KCl 20 mEq/L 1,000 mL (07/13/17 2121)   PRN Meds:.diphenhydrAMINE, enalaprilat, morphine injection, ondansetron (ZOFRAN) IV, oxyCODONE, promethazine, simethicone  Assessment/Plan: Patient Active Problem List   Diagnosis Date Noted  . Obesity, Class III, BMI 40-49.9 (morbid obesity) ([place]) 07/13/2017  . Essential hypertension 07/13/2017  . Chronic pain of multiple joints 07/13/2017  . GERD (gastroesophageal reflux disease) 07/13/2017  . Fibromyalgia 07/13/2017  . Morbid obesity ([place]) 07/13/2017   s/p Procedure(s): LAPAROSCOPIC GASTRIC SLEEVE RESECTION WITH UPPER ENDOSCOPY 07/13/2017  Doing well No fever, no tachycardia Tolerating water, cont shakes Start dc teaching Cont chemical vte prophylaxis  Disposition:  LOS: 1 day  The patient should be discharged from the hospital today  [name], MD [phone] [facility]

## 2017-07-14 NOTE — Progress Notes (Signed)
Patient alert and oriented, pain is controlled. Patient is tolerating fluids, advanced to protein shake today, patient is tolerating well.  Reviewed Gastric sleeve discharge instructions with patient and patient is able to articulate understanding.  Provided information on BELT program, Support Group and [facility] outpatient pharmacy. All questions answered, will continue to monitor.  

## 2017-07-14 NOTE — Progress Notes (Signed)
Discharge and medication instructions reviewed with patient; questions answered; patient denies further questions. Spouse is en route to drive patient home. Donne Hazel, RN

## 2017-07-16 ENCOUNTER — Telehealth (HOSPITAL_COMMUNITY): Payer: Self-pay

## 2017-07-16 NOTE — Telephone Encounter (Signed)
Follow up with bariatric surgical patient to discuss post discharge questions.  No answer at this time voicemail left for patient along with contact information to discuss the following questions.  1.  Are you having any pain not relieved by pain medication?no, tylenol has worked just as well  2.  How much fluid total fluid intake have you had in the last 24/48 hours?  40 ounces  3.  How much protein intake have you had in the last 24/48 hours?35 grams  4.  Have you had any trouble making urine?yes  5.  Have you had nausea that has not been relieved by nausea medication? Medication works when [employer] used a few  6.  Are you ambulating every hour?yes  7.  Are you passing gas or had a BM?had BM today  8.  Do you know how to contact [place]? [facility]? NDES?yes  9.  Are you taking your vitamins and calcium without difficulty?yes  10. Tell me how your incision looks?  Any redness, open incision, or drainage?look good

## 2017-07-16 NOTE — Discharge Summary (Signed)
Physician Discharge Summary  Nicole [name] ACZ:[id number] DOB: 1976-03-02 DOA: 07/13/2017  PCP: [name], PA-C  Admit date: 07/13/2017 Discharge date: 07/14/2017  Recommendations for Outpatient Follow-up:  1.   Follow-up Information    [facility], [place]. Go on 07/31/2017.   Specialty:  General Surgery Why:  at [address] information: [address] [place] [address] [phone]        Nicole Pickerel, MD Follow up.   Specialty:  General Surgery Contact information: Nicole [name] [address] [phone]          Discharge Diagnoses:  Principal Problem:   Obesity, Class III, BMI 40-49.9 (morbid obesity) ([place]) Active Problems:   Essential hypertension   Chronic pain of multiple joints   GERD (gastroesophageal reflux disease)   Fibromyalgia   Morbid obesity ([place])   Surgical Procedure: Laparoscopic Sleeve Gastrectomy, upper endoscopy  Discharge Condition: Good Disposition: Home  Diet recommendation: Postoperative sleeve gastrectomy diet (liquids only)  Filed Weights   07/13/17 1227  Weight: 99.3 kg (219 lb)     Hospital Course:  The patient was admitted for a planned laparoscopic sleeve gastrectomy. Please see operative note. Preoperatively the patient was given 5000 units of subcutaneous heparin for DVT prophylaxis. Postoperative prophylactic Lovenox dosing was started on the evening of postoperative day 0. ERAS protocol was used. On the evening of postoperative day 0, the patient was started on water and ice chips. On postoperative day 1 the patient had no fever or tachycardia and was tolerating water in their diet was gradually advanced throughout the day. The patient was ambulating without difficulty. Their vital signs are stable without fever or tachycardia. Their hemoglobin had remained stable. The patient had received discharge instructions and counseling. They were deemed stable for discharge and had met  discharge criteria   Discharge Instructions  Discharge Instructions    Ambulate hourly while awake    Complete by:  As directed    Call MD for:  difficulty breathing, headache or visual disturbances    Complete by:  As directed    Call MD for:  persistant dizziness or light-headedness    Complete by:  As directed    Call MD for:  persistant nausea and vomiting    Complete by:  As directed    Call MD for:  redness, tenderness, or signs of infection (pain, swelling, redness, odor or green/yellow discharge around incision site)    Complete by:  As directed    Call MD for:  severe uncontrolled pain    Complete by:  As directed    Call MD for:  temperature >101 F    Complete by:  As directed    Diet bariatric full liquid    Complete by:  As directed    Discharge instructions    Complete by:  As directed    See bariatric discharge instructions   Incentive spirometry    Complete by:  As directed    Perform hourly while awake     Allergies as of 07/14/2017      Reactions   Codeine Itching, Nausea And Vomiting, Nausea Only      Medication List    STOP taking these medications   naproxen sodium 220 MG tablet Commonly known as:  ALEVE     TAKE these medications   b complex vitamins tablet Take 1 tablet by mouth daily.   CALCIUM 600 PO Take 600 mg by mouth daily.   carisoprodol  350 MG tablet Commonly known as:  SOMA Take 350 mg by mouth 2 (two) times daily as needed for muscle spasms.   diphenhydrAMINE 25 MG tablet Commonly known as:  BENADRYL Take 25 mg by mouth every 6 (six) hours as needed (for migraine).   escitalopram 10 MG tablet Commonly known as:  LEXAPRO Take 10 mg by mouth daily.   magnesium gluconate 500 MG tablet Commonly known as:  MAGONATE Take 500 mg by mouth 2 (two) times daily.   Melatonin 5 MG Caps Take 15 mg by mouth at bedtime.   metoCLOPramide 10 MG tablet Commonly known as:  REGLAN Take 10 mg by mouth every 6 (six) hours as needed (for  migraine).   omeprazole 40 MG capsule Commonly known as:  PRILOSEC Take 40 mg by mouth daily.   oxyCODONE 5 MG/5ML solution Commonly known as:  ROXICODONE Take 5-10 mLs (5-10 mg total) by mouth every 4 (four) hours as needed for moderate pain or severe pain.   PRENATAL PO Take 1 tablet by mouth 2 (two) times daily.   PROBIOTIC PO Take 1 capsule by mouth daily.   prochlorperazine 10 MG tablet Commonly known as:  COMPAZINE Take 10 mg by mouth every 6 (six) hours as needed (for migraine).   propranolol 80 MG tablet Commonly known as:  INDERAL Take 80 mg by mouth 2 (two) times daily. Notes to patient:  Monitor Blood Pressure Daily and keep a log for primary care physician.  You may need to make changes to your medications with rapid weight loss.     traZODone 50 MG tablet Commonly known as:  DESYREL Take 50 mg by mouth at bedtime.   VITAMIN C PO Take 1 tablet by mouth daily.   zolpidem 10 MG tablet Commonly known as:  AMBIEN Take 10 mg by mouth at bedtime as needed for sleep.      Follow-up Information    [facility], [place]. Go on 07/31/2017.   Specialty:  General Surgery Why:  at [address] information: [address] [name] [address] [phone]        Nicole Pickerel, MD Follow up.   Specialty:  General Surgery Contact information: [name] [address] [phone]            The results of significant diagnostics from this hospitalization (including imaging, microbiology, ancillary and laboratory) are listed below for reference.    Significant Diagnostic Studies: No results found.  Labs: Basic Metabolic Panel:  Recent Labs Lab 07/14/17 0543  NA 140  K 4.0  CL 105  CO2 26  GLUCOSE 115*  BUN 12  CREATININE 0.78  CALCIUM 8.5*   Liver Function Tests:  Recent Labs Lab 07/14/17 0543  AST 26  ALT 28  ALKPHOS 48  BILITOT 0.4  PROT 6.7  ALBUMIN 3.4*    CBC:  Recent Labs Lab  07/13/17 1814 07/14/17 0543  WBC  --  12.5*  NEUTROABS  --  9.4*  HGB 12.9 11.4*  HCT 38.8 35.2*  MCV  --  81.3  PLT  --  380    CBG:  Recent Labs Lab 07/13/17 1136  GLUCAP 105*    Principal Problem:   Obesity, Class III, BMI 40-49.9 (morbid obesity) ([place]) Active Problems:   Essential hypertension   Chronic pain of multiple joints   GERD (gastroesophageal reflux disease)   Fibromyalgia   Morbid obesity ([place])   Time coordinating discharge: 10 min  Signed:  [name], MD [facility], [place] 07/16/2017, 4:15 PM

## 2017-07-16 NOTE — Telephone Encounter (Signed)
Patient had questions regarding protein options.  Counseled on protein requirements

## 2017-07-21 ENCOUNTER — Ambulatory Visit: Payer: Medicare Other | Admitting: Psychiatry

## 2017-07-28 ENCOUNTER — Ambulatory Visit: Payer: Medicare Other | Admitting: Registered"

## 2017-08-03 ENCOUNTER — Telehealth: Payer: Self-pay | Admitting: Registered"

## 2017-08-03 NOTE — Telephone Encounter (Signed)
Pt called with questions about pre-op diet.   RD returned phone call and left voicemail message to call back.

## 2017-08-04 ENCOUNTER — Encounter: Payer: Medicare Other | Attending: General Surgery | Admitting: Skilled Nursing Facility1

## 2017-08-04 DIAGNOSIS — Z713 Dietary counseling and surveillance: Secondary | ICD-10-CM | POA: Insufficient documentation

## 2017-08-04 DIAGNOSIS — Z6841 Body Mass Index (BMI) 40.0 and over, adult: Secondary | ICD-10-CM | POA: Insufficient documentation

## 2017-08-05 ENCOUNTER — Encounter: Payer: Self-pay | Admitting: Skilled Nursing Facility1

## 2017-08-05 NOTE — Progress Notes (Signed)
Bariatric Class:  Appt start time: 1530 end time:  1630.  2 Week Post-Operative Nutrition Class  Patient was seen on 08/04/2017 for Post-Operative Nutrition education at [facility].   Pt admits to having desserts, heavy whipping cream and potatoes made several different ways stating she was hungry and gave the impression she did not have plans to change the way she has been eating. Dietitian educated the pt on this. Pt also states she has been consuming 98 grams of protein a day.    Surgery date: 07/13/2017 Surgery type: sleeve Start weight at [facility]: 217.4 Weight today: 203.2   TANITA  BODY COMP RESULTS  08/04/2017   BMI (kg/m^2) 39.7   Fat Mass (lbs) 98.4   Fat Free Mass (lbs) 104.8   Total Body Water (lbs) 75.8   The following the learning objectives were met by the patient during this course:  Identifies Phase 3A (Soft, High Proteins) Dietary Goals and will begin from 2 weeks post-operatively to 2 months post-operatively  Identifies appropriate sources of fluids and proteins   States protein recommendations and appropriate sources post-operatively  Identifies the need for appropriate texture modifications, mastication, and bite sizes when consuming solids  Identifies appropriate multivitamin and calcium sources post-operatively  Describes the need for physical activity post-operatively and will follow MD recommendations  States when to call healthcare provider regarding medication questions or post-operative complications  Handouts given during class include:  Phase 3A: Soft, High Protein Diet Handout  Follow-Up Plan: Patient will follow-up at [facility] in 6 weeks for 2 month post-op nutrition visit for diet advancement per MD.

## 2017-09-04 ENCOUNTER — Telehealth: Payer: Self-pay | Admitting: Registered"

## 2017-09-04 NOTE — Telephone Encounter (Signed)
Pt wants to know what to eat at 8 week post-op. RD informed pt of non-starchy vegetables acceptable at this point. Pt states scale is not moving and concerned. Pt states she is drinking more protein than eating due to not being able to tolerate chicken or beef well; handles shrimp and fish well.

## 2017-09-17 ENCOUNTER — Ambulatory Visit: Payer: Self-pay | Admitting: Skilled Nursing Facility1

## 2017-09-23 ENCOUNTER — Ambulatory Visit: Payer: Self-pay | Admitting: Skilled Nursing Facility1

## 2017-11-27 ENCOUNTER — Other Ambulatory Visit: Payer: Self-pay | Admitting: General Surgery

## 2017-11-27 DIAGNOSIS — Z9884 Bariatric surgery status: Secondary | ICD-10-CM

## 2017-12-18 ENCOUNTER — Encounter: Payer: Self-pay | Admitting: Registered"

## 2017-12-18 ENCOUNTER — Encounter: Payer: Medicare Other | Attending: General Surgery | Admitting: Registered"

## 2017-12-18 DIAGNOSIS — Z713 Dietary counseling and surveillance: Secondary | ICD-10-CM | POA: Diagnosis present

## 2017-12-18 DIAGNOSIS — Z6841 Body Mass Index (BMI) 40.0 and over, adult: Secondary | ICD-10-CM | POA: Diagnosis not present

## 2017-12-18 DIAGNOSIS — E669 Obesity, unspecified: Secondary | ICD-10-CM

## 2017-12-18 NOTE — Progress Notes (Signed)
Follow-up visit:  5 Months Post-Operative Sleeve Gastrectomy Surgery  Medical Nutrition Therapy:  Appt start time: 10:11 end time:  10:47.  Primary concerns today: Post-operative Bariatric Surgery Nutrition Management.  Non scale victories: increased energy  Surgery date: 07/13/2017 Surgery type: sleeve Start weight at [facility]: 217.4 Weight today: 188.4 Weight change: 14.8 lbs loss from 203.2 (08/04/2017) Total weight lost: 29 lbs Weight loss goal: none stated    TANITA  BODY COMP RESULTS  08/04/2017 12/18/2017   BMI (kg/m^2) 39.7 36.8   Fat Mass (lbs) 98.4 84.8   Fat Free Mass (lbs) 104.8 103.6   Total Body Water (lbs) 75.8 74.2   Pt arrives 9 min late with 44 year old daughter. Pt states her kidney stones have been terrible and she has been retaining a lot of fluid. Pt states she had more energy after surgery, but now does not have much energy. Pt states energy decreased once eating solid foods again. Pt states she wanted surgery to be able to trigger and stop her from eating, but it hasn't. Pt states she chews at least 20 times per bite, puts fork down between bites. Pt states she feels like she is starving again 30 min after eating. Pt states when she starts her day off with eating, she continues to eat non-stop. Pt states she does better when drinking 2 shakes a day and eating dinner because then she only eats the remainder of the night. Pt states she sips protein shakes throughout the day. Pt states she is managing a lot right now: dad broke back and diagnosed with cancer, another family member on liver transplant list, and she never has time for herself. Pt states stress is contributing to her eating constantly and sleep is not restful. Pt states she does not know how to have balance in her life. Pt states she tracks her protein and averages 70-120 g/day.   Next appointment: discuss mental health professionals, vitamins and minerals, daily habits.    Preferred Learning Style:   No  preference indicated   Learning Readiness:   Not ready  Contemplating  Ready  Change in progress  24-hr recall: B (AM): eggs (12g) with mayo, bacon (6g) Snk (AM): none  L (PM): protein shake (30g) Snk (4 PM): 1/2 bacon, tomato sandwich on 1/2 carb wrap D (6:30 PM):  1/2 c beef stew Snk (7:30 PM):  beef stew 8 PM: strawberries 9:30 PM: vanilla pudding 11 PM: pork rinds  Fluid intake: protein coffee (30g), water; ~45 ounces Estimated total protein intake: 70-120 g/day  Medications: See list Supplementation: none stated  Drinking while eating: no Having you been chewing well: yes Last Lap-Band fill: N/A  Recent physical activity:  Walking sometimes  Progress Towards Goal(s):  In progress.  Handouts given during visit include:  Phase IV: High Protein + NS vegetables   Nutritional Diagnosis:  Inadequate fluid intake As related to bariatric surgery post-op recommendations.  As evidenced by pt report of less than 64 ounces of fluid a day.    Intervention:  Nutrition education and counseling. Pt was educated and counseled on the importance of stress management, mindful eating, and adequate sleep. Pt was encouraged to increase non-starchy vegetable intake and decrease protein shake intake.  Goals:  Follow Phase 3B: High Protein + Non-Starchy Vegetables  Eat 3-6 small meals/snacks, every 3-5 hrs  Increase lean protein foods to meet 60g goal  Increase fluid intake to 64oz +  Avoid drinking 15 minutes before, during and 30 minutes after  eating  Aim for >30 min of physical activity daily  - Aim to decrease protein shake intake.  - Aim to increase non-starchy vegetable.   Teaching Method Utilized:  Visual Auditory Hands on  Barriers to learning/adherence to lifestyle change: family stress  Demonstrated degree of understanding via:  Teach Back   Monitoring/Evaluation:  Dietary intake, exercise, lap band fills, and body weight. Follow up in 2-3 weeks for 6 month  post-op visit.

## 2017-12-18 NOTE — Patient Instructions (Addendum)
Goals:  Follow Phase 3B: High Protein + Non-Starchy Vegetables  Eat 3-6 small meals/snacks, every 3-5 hrs  Increase lean protein foods to meet 60g goal  Increase fluid intake to 64oz +  Avoid drinking 15 minutes before, during and 30 minutes after eating  Aim for >30 min of physical activity daily  - Aim to decrease protein shake intake.   - Aim to increase non-starchy vegetable.

## 2018-01-04 ENCOUNTER — Ambulatory Visit: Payer: Self-pay | Admitting: Registered"

## 2018-04-13 ENCOUNTER — Ambulatory Visit
Admission: RE | Admit: 2018-04-13 | Discharge: 2018-04-13 | Disposition: A | Discharge status: Home / Self Care | Payer: Medicare Other | Source: Ambulatory Visit | Attending: General Surgery | Admitting: General Surgery

## 2018-04-13 DIAGNOSIS — Z9884 Bariatric surgery status: Secondary | ICD-10-CM

## 2018-04-15 ENCOUNTER — Other Ambulatory Visit: Payer: Self-pay | Admitting: General Surgery

## 2018-04-20 ENCOUNTER — Other Ambulatory Visit: Payer: Self-pay

## 2018-05-12 ENCOUNTER — Encounter: Payer: Self-pay | Admitting: Gastroenterology

## 2018-05-28 ENCOUNTER — Encounter: Payer: Self-pay | Admitting: Gastroenterology

## 2018-05-31 ENCOUNTER — Encounter: Payer: Self-pay | Admitting: Gastroenterology

## 2018-05-31 ENCOUNTER — Ambulatory Visit (INDEPENDENT_AMBULATORY_CARE_PROVIDER_SITE_OTHER): Payer: Medicare Other | Admitting: Gastroenterology

## 2018-05-31 VITALS — BP 112/68 | HR 66 | Ht 60.0 in | Wt 174.0 lb

## 2018-05-31 DIAGNOSIS — Z9884 Bariatric surgery status: Secondary | ICD-10-CM

## 2018-05-31 DIAGNOSIS — R1310 Dysphagia, unspecified: Secondary | ICD-10-CM | POA: Diagnosis not present

## 2018-05-31 DIAGNOSIS — Z86010 Personal history of colonic polyps: Secondary | ICD-10-CM | POA: Diagnosis not present

## 2018-05-31 DIAGNOSIS — K219 Gastro-esophageal reflux disease without esophagitis: Secondary | ICD-10-CM | POA: Diagnosis not present

## 2018-05-31 MED ORDER — PANTOPRAZOLE SODIUM 40 MG PO TBEC: 40.0000 mg | DELAYED_RELEASE_TABLET | Freq: Two times a day (BID) | ORAL | 3 refills | Status: DC

## 2018-05-31 MED ORDER — RANITIDINE HCL 150 MG PO TABS: 150.0000 mg | ORAL_TABLET | Freq: Every day | ORAL | 3 refills | Status: DC

## 2018-05-31 NOTE — Patient Instructions (Signed)
If you are age 44 or older, your body mass index should be between 23-30. Your Body mass index is 33.98 kg/m. If this is out of the aforementioned range listed, please consider follow up with your Primary Care Provider.  If you are age 30 or younger, your body mass index should be between 19-25. Your Body mass index is 33.98 kg/m. If this is out of the aformentioned range listed, please consider follow up with your Primary Care Provider.   You have been scheduled for an endoscopy. Please follow written instructions given to you at your visit today. If you use inhalers (even only as needed), please bring them with you on the day of your procedure. Your physician has requested that you go to www.startemmi.com and enter the access code given to you at your visit today. This web site gives a general overview about your procedure. However, you should still follow specific instructions given to you by our office regarding your preparation for the procedure.  Stop taking Prilosec.  We have sent the following medications to your pharmacy for you to pick up at your convenience: Protonix 40mg  by mouth twice daily. Zantac 150mg  at bedtime.  It was a pleasure to see you today!  Vito Cirigliano, D.O.

## 2018-05-31 NOTE — Progress Notes (Signed)
Chief Complaint: GERD, dysphagia  Referring Provider:     Leighton Ruff. Redmond Pulling, MD   HPI:     Nicole [name] is a 42 y.o. female referred to the [facility]astroenterology Clinic for evaluation of GERD. She has a history Sleeve Gastrectomy in 06/2017.  She states she has been having reflux for many years, which has worsened since surgery in Oct 2018. Index sxs of HB, regurgitation, worse with supine, particularly if eating close to bedtime (which she has eliminated). Has since developed dysphagia to solids without odynophagia, pointing to suprasternal notch.  Dysphagia now occurring near daily.  No history of food impaction.  Does not recall dysphagia prior to surgery. Had UGI series done in July which was w/o stricture, mass, or ulcer, but the 13 mm barium tablet was lodged at the GEJ, requiring multiple repeat swallows to eventually clear.  She takes multiple acid suppression medications/antacids- Protonix 40 mg QAM, Prilosec 40 mg QPM, along with Carafate QID and 6-10 Tums per day for breakthrough symptoms. No hematemesis. Has modified diet and avoids eating within 2 to 3 hours of bedtime. All still with breakthough reflux sxs.   Had EGD 15+ years ago in [place] for reflux sxs.  She does not recall the results, but no history of esophageal dilation.  Additionally, she has had approximately 8-10 colonoscopies in her lifetime, initially for IBS.  Her last colonoscopy was approximately 15 years ago in [place] as well.  She reports a history of colon polyps and was having short interval surveillance colonoscopy, but does not recall any histology results.  She eventually just stopped going for repeat colonoscopy.  She does not have any of these records.  Otherwise no family history of colon cancer, GI malignancy, IBD.  Past Medical History:  Diagnosis Date  . Anemia   . Anxiety   . Cancer ([place])    cervical   . COPD (chronic obstructive pulmonary disease) (HCC)    hx of   . Fibromyalgia     . GERD (gastroesophageal reflux disease)   . Headache    hx of   . History of kidney stones   . Hyperlipidemia   . Hypertension   . Insomnia   . Neuromuscular disorder (HCC)    neuropathy  . Obesity   . PONV (postoperative nausea and vomiting)   . Pre-diabetes      Past Surgical History:  Procedure Laterality Date  . bilateral breast implant surgery     . BLADDER INSTILLATION    . Bladder stimulator      turned off per patient   . DILATION AND CURETTAGE OF UTERUS    . kidney stones    . LAPAROSCOPIC GASTRIC SLEEVE RESECTION N/A 07/13/2017   Procedure: LAPAROSCOPIC GASTRIC SLEEVE RESECTION WITH UPPER ENDOSCOPY;  Surgeon: [name], MD;  Location: [facility];  Service: General;  Laterality: N/A;  . laser neck surgery     . left knee urgery     . TUBAL LIGATION     Family History  Problem Relation Age of Onset  . Colon cancer Father   . Colon cancer Paternal Grandfather    Social History   Tobacco Use  . Smoking status: Former Research scientist (life sciences)  . Smokeless tobacco: Never Used  Substance Use Topics  . Alcohol use: No  . Drug use: No   Current Outpatient Medications  Medication Sig Dispense Refill  . Ascorbic Acid (VITAMIN C PO) Take 1  tablet by mouth daily.    Marland Kitchen b complex vitamins tablet Take 1 tablet by mouth daily.    . Calcium Carbonate (CALCIUM 600 PO) Take 600 mg by mouth daily.    . calcium carbonate (TUMS - DOSED IN MG ELEMENTAL CALCIUM) 500 MG chewable tablet Chew 1 tablet by mouth daily.    . carisoprodol (SOMA) 350 MG tablet Take 350 mg by mouth 2 (two) times daily as needed for muscle spasms.    . diphenhydrAMINE (BENADRYL) 25 MG tablet Take 25 mg by mouth every 6 (six) hours as needed (for migraine).    . lansoprazole (PREVACID) 30 MG capsule Take 30 mg by mouth daily at 12 noon.    . magnesium gluconate (MAGONATE) 500 MG tablet Take 500 mg by mouth 2 (two) times daily.    . Melatonin 5 MG CAPS Take 15 mg by mouth at bedtime.    . metoCLOPramide (REGLAN) 10 MG  tablet Take 10 mg by mouth every 6 (six) hours as needed (for migraine).    Marland Kitchen omeprazole (PRILOSEC) 40 MG capsule Take 40 mg by mouth daily.    Marland Kitchen oxyCODONE (ROXICODONE) 5 MG/5ML solution Take 5-10 mLs (5-10 mg total) by mouth every 4 (four) hours as needed for moderate pain or severe pain.  0  . Prenatal Vit-Fe Fumarate-FA (PRENATAL PO) Take 1 tablet by mouth 2 (two) times daily.    . Probiotic Product (PROBIOTIC PO) Take 1 capsule by mouth daily.    . prochlorperazine (COMPAZINE) 10 MG tablet Take 10 mg by mouth every 6 (six) hours as needed (for migraine).    . propranolol (INDERAL) 80 MG tablet Take 80 mg by mouth 2 (two) times daily.    . sucralfate (CARAFATE) 1 g tablet Take 1 g by mouth 4 (four) times daily.    . traZODone (DESYREL) 50 MG tablet Take 50 mg by mouth at bedtime.     No current facility-administered medications for this visit.    Allergies  Allergen Reactions  . Codeine Itching, Nausea And Vomiting and Nausea Only     Review of Systems: All systems reviewed and negative except where noted in HPI.     Physical Exam:    Wt Readings from Last 3 Encounters:  05/31/18 174 lb (78.9 kg)  12/18/17 188 lb 6.4 oz (85.5 kg)  08/05/17 203 lb 3.2 oz (92.2 kg)    BP 112/68   Pulse 66   Ht 5' (1.524 m)   Wt 174 lb (78.9 kg)   BMI 33.98 kg/m  Constitutional:  Pleasant, in no acute distress. Psychiatric: Normal mood and affect. Behavior is normal. EENT: Pupils normal.  Conjunctivae are normal. No scleral icterus. Neck supple. No cervical LAD. Cardiovascular: Normal rate, regular rhythm. No edema Pulmonary/chest: Effort normal and breath sounds normal. No wheezing, rales or rhonchi. Abdominal: Soft, nondistended, nontender. Bowel sounds active throughout. There are no masses palpable. No hepatomegaly. Neurological: Alert and oriented to person place and time. Skin: Skin is warm and dry. No rashes noted.   ASSESSMENT AND PLAN;   CECEILIA [name] is a 42 y.o. female  presenting with:  1) GERD: Long-standing history of reflux which unfortunately worsened since her sleeve gastrectomy in October 2018.  Only mild clinical improvement with multiple acid suppression medications, requiring multiple as needed antacids for breakthrough symptoms. - Change PPI to Protonix 40 mg PO BID.  Stop Prilosec - Start Zantac 150 mg qhs - Resume antirelfux lifestyle measures - Reminded her that PPI should  be taken it 30-60 minutes prior to a decent meal (breakfast and dinner for her) as that is the way the pill is designed to work most effectively. - EGD to eval for erosive esophagitis and LES laxity  2) Dysphagia: - EGD as above, with dilation as indicated - Advised patient to cut food into small pieces, eat small bites, chew food thoroughly and with plenty of liquids to avoid food impaction.  3) Hx of colon polyps - Will try to obtain old colonoscopy records to determine if she needs a repeat colonoscopy now for polyp surveillance vs waiting until age 65 for routine CRC screening.  Will review on follow-up appointment.  4) Hx of Sleeve Gastrectomy: - Has f/u scheduled with Surgery. Depending on EGD findings and clinical response to therapy, may need to consider RYGB, but certainly defer to surgical expertise regarding this.  The indications, risks, and benefits of EGD were explained to the patient in detail. Risks include but are not limited to bleeding, perforation, adverse reaction to medications, and cardiopulmonary compromise. Sequelae include but are not limited to the possibility of surgery, hositalization, and mortality. The patient verbalized understanding and wished to proceed. All questions answered, referred to scheduler. Further recommendations pending results of the exam.    Lavena Bullion, DO, FACG  05/31/2018, 9:19 AM   Stevphen Rochester, Mardella Layman, P*

## 2018-06-10 ENCOUNTER — Encounter: Payer: Self-pay | Admitting: Gastroenterology

## 2018-06-10 ENCOUNTER — Ambulatory Visit (AMBULATORY_SURGERY_CENTER): Payer: Medicare Other | Admitting: Gastroenterology

## 2018-06-10 VITALS — BP 107/54 | HR 68 | Temp 98.2°F | Resp 12 | Ht 60.0 in | Wt 174.0 lb

## 2018-06-10 DIAGNOSIS — K2970 Gastritis, unspecified, without bleeding: Secondary | ICD-10-CM

## 2018-06-10 DIAGNOSIS — K317 Polyp of stomach and duodenum: Secondary | ICD-10-CM | POA: Diagnosis not present

## 2018-06-10 DIAGNOSIS — K222 Esophageal obstruction: Secondary | ICD-10-CM | POA: Diagnosis not present

## 2018-06-10 DIAGNOSIS — K219 Gastro-esophageal reflux disease without esophagitis: Secondary | ICD-10-CM

## 2018-06-10 DIAGNOSIS — R1310 Dysphagia, unspecified: Secondary | ICD-10-CM

## 2018-06-10 DIAGNOSIS — K2990 Gastroduodenitis, unspecified, without bleeding: Secondary | ICD-10-CM

## 2018-06-10 DIAGNOSIS — K209 Esophagitis, unspecified without bleeding: Secondary | ICD-10-CM

## 2018-06-10 MED ORDER — SODIUM CHLORIDE 0.9 % IV SOLN: 500.0000 mL | Freq: Once | INTRAVENOUS | Status: DC

## 2018-06-10 NOTE — Patient Instructions (Signed)
Handouts:  Dilation diet information and Gastritis information.  YOU HAD AN ENDOSCOPIC PROCEDURE TODAY AT [facility]:   Refer to the procedure report that was given to you for any specific questions about what was found during the examination.  If the procedure report does not answer your questions, please call your gastroenterologist to clarify.  If you requested that your care partner not be given the details of your procedure findings, then the procedure report has been included in a sealed envelope for you to review at your convenience later.  YOU SHOULD EXPECT: Some feelings of bloating in the abdomen. Passage of more gas than usual.  Walking can help get rid of the air that was put into your GI tract during the procedure and reduce the bloating. If you had a lower endoscopy (such as a colonoscopy or flexible sigmoidoscopy) you may notice spotting of blood in your stool or on the toilet paper. If you underwent a bowel prep for your procedure, you may not have a normal bowel movement for a few days.  Please Note:  You might notice some irritation and congestion in your nose or some drainage.  This is from the oxygen used during your procedure.  There is no need for concern and it should clear up in a day or so.  SYMPTOMS TO REPORT IMMEDIATELY:   Following upper endoscopy (EGD)  Vomiting of blood or coffee ground material  New chest pain or pain under the shoulder blades  Painful or persistently difficult swallowing  New shortness of breath  Fever of 100F or higher  Black, tarry-looking stools  For urgent or emergent issues, a gastroenterologist can be reached at any hour by calling [phone].   DIET:  We do recommend a small meal at first, but then you may proceed to your regular diet.  Drink plenty of fluids but you should avoid alcoholic beverages for 24 hours.  ACTIVITY:  You should plan to take it easy for the rest of today and you should NOT DRIVE or use heavy  machinery until tomorrow (because of the sedation medicines used during the test).    FOLLOW UP: Our staff will call the number listed on your records the next business day following your procedure to check on you and address any questions or concerns that you may have regarding the information given to you following your procedure. If we do not reach you, we will leave a message.  However, if you are feeling well and you are not experiencing any problems, there is no need to return our call.  We will assume that you have returned to your regular daily activities without incident.  If any biopsies were taken you will be contacted by phone or by letter within the next 1-3 weeks.  Please call us at [phone] if you have not heard about the biopsies in 3 weeks.    SIGNATURES/CONFIDENTIALITY: You and/or your care partner have signed paperwork which will be entered into your electronic medical record.  These signatures attest to the fact that that the information above on your After Visit Summary has been reviewed and is understood.  Full responsibility of the confidentiality of this discharge information lies with you and/or your care-partner.

## 2018-06-10 NOTE — Op Note (Signed)
[name] Procedure Date: 06/10/2018 11:36 AM MRN: [id number] Endoscopist: Gerrit Heck , MD Age: 42 Referring MD:  Date of Birth: 1976-03-04 Gender: Female Account #: [id number] Procedure:                Upper GI endoscopy Indications:              Dysphagia, Heartburn, Suspected esophageal reflux                           42 yo female with a history of GERD which is worse                            since sleeve gastrectomy in 2018. Additionally,                            endorsing dysphagia with recent UGI series notable                            for delayed transit of barium tablet through GEJ.                            Currently on high dose acid suppression therapy. Medicines:                Monitored Anesthesia Care Procedure:                Pre-Anesthesia Assessment:                           - Prior to the procedure, a History and Physical                            was performed, and patient medications and                            allergies were reviewed. The patient's tolerance of                            previous anesthesia was also reviewed. The risks                            and benefits of the procedure and the sedation                            options and risks were discussed with the patient.                            All questions were answered, and informed consent                            was obtained. Prior Anticoagulants: The patient has                            taken no previous anticoagulant or antiplatelet  agents. ASA Grade Assessment: II - A patient with                            mild systemic disease. After reviewing the risks                            and benefits, the patient was deemed in                            satisfactory condition to undergo the procedure.                           After obtaining informed consent, the endoscope was   passed under direct vision. Throughout the                            procedure, the patient's blood pressure, pulse, and                            oxygen saturations were monitored continuously. The                            Endoscope was introduced through the mouth, and                            advanced to the second part of duodenum. The upper                            GI endoscopy was accomplished without difficulty.                            The patient tolerated the procedure well. Scope In: Scope Out: Findings:                 LA Grade A (one or more mucosal breaks less than 5                            mm, not extending between tops of 2 mucosal folds)                            esophagitis with no bleeding was found 37 cm from                            the incisors. Biopsies were taken with a cold                            forceps for histology. Estimated blood loss was                            minimal.                           One benign-appearing, intrinsic mild stenosis was  found 34 cm from the incisors. This stenosis                            measured 1 cm (in length). The stenosis was                            traversed. A TTS dilator was passed through the                            scope. Dilation with a 15-16.5-18 mm balloon                            dilator was performed to 18 mm without mucosal                            rent. Dilation was then performed with an 18-19-20                            mm balloon dilator to maximum 20 mm. The dilation                            site was examined and showed mild mucosal                            disruption with appropriate capillary leak.                            Estimated blood loss was minimal.                           The upper third of the esophagus and middle third                            of the esophagus were normal.                           Evidence of a sleeve  gastrectomy was found in the                            gastric fundus and in the gastric body. This was                            characterized by erythema. Biopsies were taken with                            a cold forceps for histology. Estimated blood loss                            was minimal.                           Localized mildly erythematous mucosa without  bleeding was found in the gastric antrum. Biopsies                            were taken with a cold forceps for histology.                            Estimated blood loss was minimal.                           A few small sessile polyps with no stigmata of                            recent bleeding were found in the gastric fundus.                            These polyps were removed with a cold biopsy                            forceps. Resection and retrieval were complete.                            Estimated blood loss was minimal.                           The duodenal bulb, first portion of the duodenum                            and second portion of the duodenum were normal. Complications:            No immediate complications. Estimated Blood Loss:     Estimated blood loss was minimal. Impression:               - LA Grade A reflux esophagitis. Biopsied.                           - Benign-appearing esophageal stenosis in teh lower                            esophagus. Dilated with 20 mm TTS balloon.                           - Normal upper third of esophagus and middle third                            of esophagus.                           - A sleeve gastrectomy was found, characterized by                            erythema. Biopsied.                           - Erythematous mucosa in the antrum. Biopsied.                           -  A few gastric polyps. Resected and retrieved.                           - Normal duodenal bulb, first portion of the                            duodenum  and second portion of the duodenum. Recommendation:           - Patient has a contact number available for                            emergencies. The signs and symptoms of potential                            delayed complications were discussed with the                            patient. Return to normal activities tomorrow.                            Written discharge instructions were provided to the                            patient.                           - Soft diet today.                           - Continue present medications.                           - Await pathology results.                           - Return to GI clinic in 3 months.                           - Follow-up with your surgeon as scheduled. Gerrit Heck, MD 06/10/2018 12:15:40 PM

## 2018-06-10 NOTE — Progress Notes (Signed)
Called to room to assist during endoscopic procedure.  Patient ID and intended procedure confirmed with present staff. Received instructions for my participation in the procedure from the performing physician.  

## 2018-06-11 ENCOUNTER — Telehealth: Payer: Self-pay | Admitting: *Deleted

## 2018-06-11 NOTE — Telephone Encounter (Signed)
  Follow up Call-  Call back number 06/10/2018  Post procedure Call Back phone  # [phone]  Permission to leave phone message Yes     Patient questions:  Message left to call us if necessary.

## 2018-06-11 NOTE — Telephone Encounter (Signed)
  Follow up Call-  Call back number 06/10/2018  Post procedure Call Back phone  # [phone]  Permission to leave phone message Yes     Patient questions:  Do you have a fever, pain , or abdominal swelling? No. Pain Score  0 *  Have you tolerated food without any problems? Yes.    Have you been able to return to your normal activities? Yes.    Do you have any questions about your discharge instructions: Diet   No. Medications  No. Follow up visit  No.  Do you have questions or concerns about your Care? No.  Actions: * If pain score is 4 or above: No action needed, pain <4.

## 2018-06-16 ENCOUNTER — Encounter: Payer: Self-pay | Admitting: Gastroenterology

## 2018-07-28 ENCOUNTER — Telehealth: Payer: Self-pay | Admitting: Skilled Nursing Facility1

## 2018-07-28 NOTE — Telephone Encounter (Signed)
Dietitian called pt to set up an appt.  Pt states she will getting the RYGB January 7th.  Pt states she forgot what she needs to be doing for the Sleeve surgery.  Pt states she will come in November 27th at 3pm.

## 2018-08-11 ENCOUNTER — Ambulatory Visit: Payer: Self-pay | Admitting: Skilled Nursing Facility1

## 2018-09-06 NOTE — Patient Instructions (Signed)
Nicole [name]  09/06/2018   Your procedure is scheduled on: 09-21-18   Report to [facility] Main  Entrance     Report to admitting at 8:15AM    Call this number if you have problems the morning of surgery [phone]      Remember: Do not eat food or drink liquids :After Midnight. BRUSH YOUR TEETH MORNING OF SURGERY AND RINSE YOUR MOUTH OUT, NO CHEWING GUM CANDY OR MINTS.     Take these medicines the morning of surgery with A SIP OF WATER: lorazepam if needed, Prilosec, Protonix, Propanolol                                 You may not have any metal on your body including hair pins and              piercings  Do not wear jewelry, make-up, lotions, powders or perfumes, deodorant             Do not wear nail polish.  Do not shave  48 hours prior to surgery.      Do not bring valuables to the hospital. [facility].  Contacts, dentures or bridgework may not be worn into surgery.  Leave suitcase in the car. After surgery it may be brought to your room.                   Please read over the following fact sheets you were given: _____________________________________________________________________             [facility] - Preparing for Surgery Before surgery, you can play an important role.  Because skin is not sterile, your skin needs to be as free of germs as possible.  You can reduce the number of germs on your skin by washing with CHG (chlorahexidine gluconate) soap before surgery.  CHG is an antiseptic cleaner which kills germs and bonds with the skin to continue killing germs even after washing. Please DO NOT use if you have an allergy to CHG or antibacterial soaps.  If your skin becomes reddened/irritated stop using the CHG and inform your nurse when you arrive at Short Stay. Do not shave (including legs and underarms) for at least 48 hours prior to the first CHG shower.  You may shave your  face/neck. Please follow these instructions carefully:  1.  Shower with CHG Soap the night before surgery and the  morning of Surgery.  2.  If you choose to wash your hair, wash your hair first as usual with your  normal  shampoo.  3.  After you shampoo, rinse your hair and body thoroughly to remove the  shampoo.                           4.  Use CHG as you would any other liquid soap.  You can apply chg directly  to the skin and wash                       Gently with a scrungie or clean washcloth.  5.  Apply the CHG Soap to your body ONLY FROM THE NECK DOWN.   Do  not use on face/ open                           Wound or open sores. Avoid contact with eyes, ears mouth and genitals (private parts).                       Wash face,  Genitals (private parts) with your normal soap.             6.  Wash thoroughly, paying special attention to the area where your surgery  will be performed.  7.  Thoroughly rinse your body with warm water from the neck down.  8.  DO NOT shower/wash with your normal soap after using and rinsing off  the CHG Soap.                9.  Pat yourself dry with a clean towel.            10.  Wear clean pajamas.            11.  Place clean sheets on your bed the night of your first shower and do not  sleep with pets. Day of Surgery : Do not apply any lotions/deodorants the morning of surgery.  Please wear clean clothes to the hospital/surgery center.  FAILURE TO FOLLOW THESE INSTRUCTIONS MAY RESULT IN THE CANCELLATION OF YOUR SURGERY PATIENT SIGNATURE_________________________________  NURSE SIGNATURE__________________________________  ________________________________________________________________________

## 2018-09-06 NOTE — Progress Notes (Signed)
HELLO. PRE-OP IS 09-09-18 . NEEDS ORDERS.  THANK YOU !

## 2018-09-09 ENCOUNTER — Other Ambulatory Visit: Payer: Self-pay

## 2018-09-09 ENCOUNTER — Encounter (HOSPITAL_COMMUNITY): Admission: RE | Admit: 2018-09-09 | Payer: Medicare Other | Source: Ambulatory Visit

## 2018-09-09 ENCOUNTER — Encounter (HOSPITAL_COMMUNITY): Payer: Self-pay

## 2018-09-09 ENCOUNTER — Encounter (HOSPITAL_COMMUNITY)
Admission: RE | Admit: 2018-09-09 | Discharge: 2018-09-09 | Disposition: A | Discharge status: Home / Self Care | Payer: Medicare Other | Source: Ambulatory Visit | Attending: General Surgery | Admitting: General Surgery

## 2018-09-09 DIAGNOSIS — Z01818 Encounter for other preprocedural examination: Secondary | ICD-10-CM | POA: Diagnosis present

## 2018-09-09 HISTORY — DX: Personal history of other diseases of the nervous system and sense organs: Z86.69

## 2018-09-09 HISTORY — DX: Other reaction to spinal and lumbar puncture: G97.1

## 2018-09-09 LAB — HCG, SERUM, QUALITATIVE: Preg, Serum: NEGATIVE

## 2018-09-09 LAB — BASIC METABOLIC PANEL
Anion gap: 7 (ref 5–15)
BUN: 11 mg/dL (ref 6–20)
CO2: 29 mmol/L (ref 22–32)
Calcium: 9.4 mg/dL (ref 8.9–10.3)
Chloride: 104 mmol/L (ref 98–111)
Creatinine, Ser: 0.74 mg/dL (ref 0.44–1.00)
GFR calc Af Amer: >60 mL/min (ref >60)
GFR calc non Af Amer: >60 mL/min (ref >60)
GLUCOSE: 78 mg/dL (ref 70–99)
Potassium: 3.5 mmol/L (ref 3.5–5.1)
Sodium: 140 mmol/L (ref 135–145)

## 2018-09-09 LAB — CBC
HEMATOCRIT: 39.7 % (ref 36.0–46.0)
Hemoglobin: 12.3 g/dL (ref 12.0–15.0)
MCH: 26.6 pg (ref 26.0–34.0)
MCHC: 31 g/dL (ref 30.0–36.0)
MCV: 85.9 fL (ref 80.0–100.0)
PLATELETS: 351 10*3/uL (ref 150–400)
RBC: 4.62 MIL/uL (ref 3.87–5.11)
RDW: 14.3 % (ref 11.5–15.5)
WBC: 6.8 10*3/uL (ref 4.0–10.5)
nRBC: 0 % (ref 0.0–0.2)

## 2018-09-20 ENCOUNTER — Telehealth: Payer: Self-pay | Admitting: Skilled Nursing Facility1

## 2018-09-20 NOTE — Telephone Encounter (Signed)
Pt called to vent about the fact her surgery was cancelled.  Pt states she will call [facility] when she gets a surgery date so she can atend pre-op class again.

## 2018-09-21 ENCOUNTER — Encounter (HOSPITAL_COMMUNITY): Admission: RE | Payer: Self-pay | Source: Home / Self Care

## 2018-09-21 ENCOUNTER — Inpatient Hospital Stay (HOSPITAL_COMMUNITY): Admission: RE | Admit: 2018-09-21 | Payer: Medicare HMO | Source: Home / Self Care | Admitting: General Surgery

## 2018-09-21 SURGERY — LAPAROSCOPIC ROUX-EN-Y GASTRIC BYPASS WITH UPPER ENDOSCOPY
Anesthesia: General

## 2018-09-29 ENCOUNTER — Inpatient Hospital Stay (HOSPITAL_COMMUNITY)
Admission: RE | Admit: 2018-09-29 | Discharge: 2018-09-29 | Disposition: A | Discharge status: Home / Self Care | Payer: Self-pay | Source: Ambulatory Visit

## 2018-09-29 ENCOUNTER — Ambulatory Visit: Payer: Self-pay | Admitting: General Surgery

## 2018-09-29 NOTE — Patient Instructions (Addendum)
Nicole [name]  09/29/2018   Your procedure is scheduled on: 10/04/2018  Report to [facility] Main  Entrance  Report to admitting at    0830 AM    Call this number if you have problems the morning of surgery [phone]   Remember: Do not eat food  :After Midnight. BRUSH YOUR TEETH MORNING OF SURGERY AND RINSE YOUR MOUTH OUT, NO CHEWING GUM CANDY OR MINTS.  NO SOLID FOOD AFTER MIDNIGHT THE NIGHT PRIOR TO SURGERY. NOTHING BY MOUTH EXCEPT CLEAR LIQUIDS UNTIL 3 HOURS PRIOR TO [place] SURGERY. PLEASE FINISH ENSURE DRINK PER SURGEON ORDER 3 HOURS PRIOR TO SCHEDULED SURGERY TIME WHICH NEEDS TO BE COMPLETED AT ______0730am______.    CLEAR LIQUID DIET   Foods Allowed                                                                     Foods Excluded  Coffee and tea, regular and decaf                             liquids that you cannot  Plain Jell-O in any flavor                                             see through such as: Fruit ices (not with fruit pulp)                                     milk, soups, orange juice  Iced Popsicles                                    All solid food Carbonated beverages, regular and diet                                    Cranberry, grape and apple juices Sports drinks like Gatorade Lightly seasoned clear broth or consume(fat free) Sugar, honey syrup  Sample Menu Breakfast                                Lunch                                     Supper Cranberry juice                    Beef broth                            Chicken broth Jell-O  Grape juice                           Apple juice Coffee or tea                        Jell-O                                      Popsicle                                                Coffee or tea                        Coffee or tea  _____________________________________________________________________    Take these medicines the morning of  surgery with A SIP OF WATER: Ativan if needed, Inderal, Protonix  DO NOT TAKE ANY DIABETIC MEDICATIONS DAY OF YOUR SURGERY                               You may not have any metal on your body including hair pins and              piercings  Do not wear jewelry, make-up, lotions, powders or perfumes, deodorant             Do not wear nail polish.  Do not shave  48 hours prior to surgery.     Do not bring valuables to the hospital. [place].  Contacts, dentures or bridgework may not be worn into surgery.  Leave suitcase in the car. After surgery it may be brought to your room.      Special Instructions: coughing and deep breathing exercises, leg exercises               Please read over the following fact sheets you were given: _____________________________________________________________________             [facility] - Preparing for Surgery Before surgery, you can play an important role.  Because skin is not sterile, your skin needs to be as free of germs as possible.  You can reduce the number of germs on your skin by washing with CHG (chlorahexidine gluconate) soap before surgery.  CHG is an antiseptic cleaner which kills germs and bonds with the skin to continue killing germs even after washing. Please DO NOT use if you have an allergy to CHG or antibacterial soaps.  If your skin becomes reddened/irritated stop using the CHG and inform your nurse when you arrive at Short Stay. Do not shave (including legs and underarms) for at least 48 hours prior to the first CHG shower.  You may shave your face/neck. Please follow these instructions carefully:  1.  Shower with CHG Soap the night before surgery and the  morning of Surgery.  2.  If you choose to wash your hair, wash your hair first as usual with your  normal  shampoo.  3.  After you shampoo, rinse your hair and body thoroughly to remove the  shampoo.  4.  Use  CHG as you would any other liquid soap.  You can apply chg directly  to the skin and wash                       Gently with a scrungie or clean washcloth.  5.  Apply the CHG Soap to your body ONLY FROM THE NECK DOWN.   Do not use on face/ open                           Wound or open sores. Avoid contact with eyes, ears mouth and genitals (private parts).                       Wash face,  Genitals (private parts) with your normal soap.             6.  Wash thoroughly, paying special attention to the area where your surgery  will be performed.  7.  Thoroughly rinse your body with warm water from the neck down.  8.  DO NOT shower/wash with your normal soap after using and rinsing off  the CHG Soap.                9.  Pat yourself dry with a clean towel.            10.  Wear clean pajamas.            11.  Place clean sheets on your bed the night of your first shower and do not  sleep with pets. Day of Surgery : Do not apply any lotions/deodorants the morning of surgery.  Please wear clean clothes to the hospital/surgery center.  FAILURE TO FOLLOW THESE INSTRUCTIONS MAY RESULT IN THE CANCELLATION OF YOUR SURGERY PATIENT SIGNATURE_________________________________  NURSE SIGNATURE__________________________________  ________________________________________________________________________

## 2018-09-29 NOTE — Patient Instructions (Addendum)
Nicole [name]  09/30/2018   Your procedure is scheduled on: Monday 10/04/2018  Report to [facility] Main  Entrance              Report to admitting at  0830  AM    Call this number if you have problems the morning of surgery [phone]    Remember: Do not eat food  :After Midnight.    MORNING OF SURGERY DRINK:  1SHAKE BEFORE YOU LEAVE HOME, DRINK ALL OF THE SHAKE AT ONE TIME.   NO SOLID FOOD AFTER 600 PM THE NIGHT BEFORE YOUR SURGERY. YOU MAY DRINK CLEAR FLUIDS. THE SHAKE YOU DRINK BEFORE YOU LEAVE HOME WILL BE THE LAST FLUIDS YOU DRINK BEFORE SURGERY.  PAIN IS EXPECTED AFTER SURGERY AND WILL NOT BE COMPLETELY ELIMINATED. AMBULATION AND TYLENOL WILL HELP REDUCE INCISIONAL AND GAS PAIN. MOVEMENT IS KEY!  YOU ARE EXPECTED TO BE OUT OF BED WITHIN 4 HOURS OF ADMISSION TO YOUR PATIENT ROOM.  SITTING IN THE RECLINER THROUGHOUT THE DAY IS IMPORTANT FOR DRINKING FLUIDS AND MOVING GAS THROUGHOUT THE GI TRACT.  COMPRESSION STOCKINGS SHOULD BE WORN [place] UNLESS YOU ARE WALKING.   INCENTIVE SPIROMETER SHOULD BE USED EVERY HOUR WHILE AWAKE TO DECREASE POST-OPERATIVE COMPLICATIONS SUCH AS PNEUMONIA.  WHEN DISCHARGED HOME, IT IS IMPORTANT TO CONTINUE TO WALK EVERY HOUR AND USE THE INCENTIVE SPIROMETER EVERY HOUR.      CLEAR LIQUID DIET   Foods Allowed                                                                     Foods Excluded  Coffee and tea, regular and decaf                             liquids that you cannot  Plain Jell-O in any flavor                                             see through such as: Fruit ices (not with fruit pulp)                                     milk, soups, orange juice  Iced Popsicles                                    All solid food Carbonated beverages, regular and diet                                    Cranberry, grape and apple juices Sports drinks like Gatorade Lightly seasoned clear broth or  consume(fat free) Sugar, honey syrup  Sample Menu Breakfast  Lunch                                     Supper Cranberry juice                    Beef broth                            Chicken broth Jell-O                                     Grape juice                           Apple juice Coffee or tea                        Jell-O                                      Popsicle                                                Coffee or tea                        Coffee or tea  _____________________________________________________________________                  BRUSH YOUR TEETH MORNING OF SURGERY AND RINSE YOUR MOUTH OUT, NO CHEWING GUM CANDY OR MINTS.     Take these medicines the morning of surgery with A SIP OF WATER:  Protonix,  Ativan if needed                                 You may not have any metal on your body including hair pins and              piercings  Do not wear jewelry, make-up, lotions, powders or perfumes, deodorant             Do not wear nail polish.  Do not shave  48 hours prior to surgery.                 Do not bring valuables to the hospital. [place].  Contacts, dentures or bridgework may not be worn into surgery.  Leave suitcase in the car. After surgery it may be brought to your room.                  Please read over the following fact sheets you were given: _____________________________________________________________________             [facility] - Preparing for Surgery Before surgery, you can play an important role.  Because skin is not sterile, your skin needs to be as free of germs as possible.  You can reduce the number  of germs on your skin by washing with CHG (chlorahexidine gluconate) soap before surgery.  CHG is an antiseptic cleaner which kills germs and bonds with the skin to continue killing germs even after washing. Please DO NOT use if you have an  allergy to CHG or antibacterial soaps.  If your skin becomes reddened/irritated stop using the CHG and inform your nurse when you arrive at Short Stay. Do not shave (including legs and underarms) for at least 48 hours prior to the first CHG shower.  You may shave your face/neck. Please follow these instructions carefully:  1.  Shower with CHG Soap the night before surgery and the  morning of Surgery.  2.  If you choose to wash your hair, wash your hair first as usual with your  normal  shampoo.  3.  After you shampoo, rinse your hair and body thoroughly to remove the  shampoo.                           4.  Use CHG as you would any other liquid soap.  You can apply chg directly  to the skin and wash                       Gently with a scrungie or clean washcloth.  5.  Apply the CHG Soap to your body ONLY FROM THE NECK DOWN.   Do not use on face/ open                           Wound or open sores. Avoid contact with eyes, ears mouth and genitals (private parts).                       Wash face,  Genitals (private parts) with your normal soap.             6.  Wash thoroughly, paying special attention to the area where your surgery  will be performed.  7.  Thoroughly rinse your body with warm water from the neck down.  8.  DO NOT shower/wash with your normal soap after using and rinsing off  the CHG Soap.                9.  Pat yourself dry with a clean towel.            10.  Wear clean pajamas.            11.  Place clean sheets on your bed the night of your first shower and do not  sleep with pets. Day of Surgery : Do not apply any lotions/deodorants the morning of surgery.  Please wear clean clothes to the hospital/surgery center.  FAILURE TO FOLLOW THESE INSTRUCTIONS MAY RESULT IN THE CANCELLATION OF YOUR SURGERY PATIENT SIGNATURE_________________________________  NURSE  SIGNATURE__________________________________  ________________________________________________________________________   [name]  An incentive spirometer is a tool that can help keep your lungs clear and active. This tool measures how well you are filling your lungs with each breath. Taking long deep breaths may help reverse or decrease the chance of developing breathing (pulmonary) problems (especially infection) following:  A long period of time when you are unable to move or be active. BEFORE THE PROCEDURE   If the spirometer includes an indicator to show your best effort, your nurse or respiratory therapist will set it to a desired  goal.  If possible, sit up straight or lean slightly forward. Try not to slouch.  Hold the incentive spirometer in an upright position. INSTRUCTIONS FOR USE  1. Sit on the edge of your bed if possible, or sit up as far as you can in bed or on a chair. 2. Hold the incentive spirometer in an upright position. 3. Breathe out normally. 4. Place the mouthpiece in your mouth and seal your lips tightly around it. 5. Breathe in slowly and as deeply as possible, raising the piston or the ball toward the top of the column. 6. Hold your breath for 3-5 seconds or for as long as possible. Allow the piston or ball to fall to the bottom of the column. 7. Remove the mouthpiece from your mouth and breathe out normally. 8. Rest for a few seconds and repeat Steps 1 through 7 at least 10 times every 1-2 hours when you are awake. Take your time and take a few normal breaths between deep breaths. 9. The spirometer may include an indicator to show your best effort. Use the indicator as a goal to work toward during each repetition. 10. After each set of 10 deep breaths, practice coughing to be sure your lungs are clear. If you have an incision (the cut made at the time of surgery), support your incision when coughing by placing a pillow or rolled up towels firmly  against it. Once you are able to get out of bed, walk around indoors and cough well. You may stop using the incentive spirometer when instructed by your caregiver.  RISKS AND COMPLICATIONS  Take your time so you do not get dizzy or light-headed.  If you are in pain, you may need to take or ask for pain medication before doing incentive spirometry. It is harder to take a deep breath if you are having pain. AFTER USE  Rest and breathe slowly and easily.  It can be helpful to keep track of a log of your progress. Your caregiver can provide you with a simple table to help with this. If you are using the spirometer at home, follow these instructions: [place] IF:   You are having difficultly using the spirometer.  You have trouble using the spirometer as often as instructed.  Your pain medication is not giving enough relief while using the spirometer.  You develop fever of 100.5 F (38.1 C) or higher. SEEK IMMEDIATE MEDICAL CARE IF:   You cough up bloody sputum that had not been present before.  You develop fever of 102 F (38.9 C) or greater.  You develop worsening pain at or near the incision site. MAKE SURE YOU:   Understand these instructions.  Will watch your condition.  Will get help right away if you are not doing well or get worse. Document Released: 01/12/2007 Document Revised: 11/24/2011 Document Reviewed: 03/15/2007 [facility] Patient Information 2014 [place], [place].   ________________________________________________________________________

## 2018-09-30 ENCOUNTER — Encounter (HOSPITAL_COMMUNITY): Payer: Self-pay

## 2018-09-30 ENCOUNTER — Encounter (HOSPITAL_COMMUNITY)
Admission: RE | Admit: 2018-09-30 | Discharge: 2018-09-30 | Disposition: A | Discharge status: Home / Self Care | Payer: Medicare HMO | Source: Ambulatory Visit | Attending: General Surgery | Admitting: General Surgery

## 2018-09-30 ENCOUNTER — Other Ambulatory Visit: Payer: Self-pay

## 2018-09-30 DIAGNOSIS — Z01812 Encounter for preprocedural laboratory examination: Secondary | ICD-10-CM | POA: Insufficient documentation

## 2018-09-30 DIAGNOSIS — Z9889 Other specified postprocedural states: Secondary | ICD-10-CM | POA: Diagnosis not present

## 2018-09-30 DIAGNOSIS — K219 Gastro-esophageal reflux disease without esophagitis: Secondary | ICD-10-CM | POA: Diagnosis not present

## 2018-09-30 LAB — CBC WITH DIFFERENTIAL/PLATELET
Abs Immature Granulocytes: 0.02 10*3/uL (ref 0.00–0.07)
BASOS PCT: 1 %
Basophils Absolute: 0.1 10*3/uL (ref 0.0–0.1)
EOS ABS: 0.2 10*3/uL (ref 0.0–0.5)
Eosinophils Relative: 2 %
HCT: 41.7 % (ref 36.0–46.0)
Hemoglobin: 13 g/dL (ref 12.0–15.0)
Immature Granulocytes: 0 %
Lymphocytes Relative: 38 %
Lymphs Abs: 3.1 10*3/uL (ref 0.7–4.0)
MCH: 26.4 pg (ref 26.0–34.0)
MCHC: 31.2 g/dL (ref 30.0–36.0)
MCV: 84.8 fL (ref 80.0–100.0)
Monocytes Absolute: 0.8 10*3/uL (ref 0.1–1.0)
Monocytes Relative: 10 %
NRBC: 0 % (ref 0.0–0.2)
Neutro Abs: 4 10*3/uL (ref 1.7–7.7)
Neutrophils Relative %: 49 %
PLATELETS: 375 10*3/uL (ref 150–400)
RBC: 4.92 MIL/uL (ref 3.87–5.11)
RDW: 14 % (ref 11.5–15.5)
WBC: 8.2 10*3/uL (ref 4.0–10.5)

## 2018-09-30 LAB — COMPREHENSIVE METABOLIC PANEL
ALT: 25 U/L (ref 0–44)
ANION GAP: 9 (ref 5–15)
AST: 22 U/L (ref 15–41)
Albumin: 4 g/dL (ref 3.5–5.0)
Alkaline Phosphatase: 59 U/L (ref 38–126)
BUN: 9 mg/dL (ref 6–20)
CO2: 24 mmol/L (ref 22–32)
Calcium: 9.1 mg/dL (ref 8.9–10.3)
Chloride: 104 mmol/L (ref 98–111)
Creatinine, Ser: 0.72 mg/dL (ref 0.44–1.00)
GFR calc non Af Amer: >60 mL/min (ref >60)
Glucose, Bld: 91 mg/dL (ref 70–99)
Potassium: 4.1 mmol/L (ref 3.5–5.1)
Sodium: 137 mmol/L (ref 135–145)
Total Bilirubin: 0.5 mg/dL (ref 0.3–1.2)
Total Protein: 7 g/dL (ref 6.5–8.1)

## 2018-09-30 LAB — ABO/RH: ABO/RH(D): O POS

## 2018-09-30 MED ORDER — ENSURE PRE-SURGERY PO LIQD
296.0000 mL | Freq: Once | ORAL | Status: DC
  Filled 2018-09-30: qty 296

## 2018-09-30 NOTE — Progress Notes (Signed)
Patient wanted to talk to [facility] today for [place], RN and myself over in Admitting. Kandra Nicolas, RN [facility] talked to patient explaining why children are not allowed allowed in the hospital at this time due to the high cases of flu. Patient was given options to come back later this afternoon or tomorrow to be seen for pre-op and patient refused as she informed us that she lived an hour away . Patient adamant that she be seen today for her pre-op as she had no other options and no daycare for child. Patient informed us that she was told by [facility] that if she did not get her labs done today , that her surgery would be cancelled.  I talked to Kandra Nicolas, RN [facility] covering house and  I brought patient and child over to Pre-op with child wearing a mask and had patient sign consent form , blood drawn and given instructions for surgery. Patient informed of risk of child being here and possible flu exposure and patient verbalized understanding.

## 2018-09-30 NOTE — Progress Notes (Signed)
Bonnie from Admitting comes over to inform me that patient arrives with child under 12 for pre-op appointment. Horris Latino informed her that children are not allowed in the hospital. I called the [facility] covering house coverage, Kandra Nicolas, RN who informed me that no exceptions , that no children under 12 are allowed in the hospital.

## 2018-10-01 ENCOUNTER — Telehealth (HOSPITAL_COMMUNITY): Payer: Self-pay

## 2018-10-01 NOTE — Telephone Encounter (Signed)
Called patient to review pre op education and follow up on any questions regarding medications, vitamins, post op diet progression, and home needs after discharge.  Discussed plan of care with patient.  Questions answered.  Contact information provided.

## 2018-10-03 MED ORDER — BUPIVACAINE LIPOSOME 1.3 % IJ SUSP
20.0000 mL | Freq: Once | INTRAMUSCULAR | Status: DC
  Filled 2018-10-03: qty 20

## 2018-10-04 ENCOUNTER — Inpatient Hospital Stay (HOSPITAL_COMMUNITY): Payer: Medicare HMO | Admitting: Registered Nurse

## 2018-10-04 ENCOUNTER — Encounter (HOSPITAL_COMMUNITY)
Admission: RE | Disposition: A | Discharge status: Home / Self Care | Payer: Self-pay | Source: Ambulatory Visit | Attending: General Surgery

## 2018-10-04 ENCOUNTER — Encounter (HOSPITAL_COMMUNITY): Payer: Self-pay | Admitting: Emergency Medicine

## 2018-10-04 ENCOUNTER — Inpatient Hospital Stay (HOSPITAL_COMMUNITY)
Admission: RE | Admit: 2018-10-04 | Discharge: 2018-10-06 | DRG: 328 | Disposition: A | Discharge status: Home / Self Care | Payer: Medicare HMO | Source: Ambulatory Visit | Attending: General Surgery | Admitting: General Surgery

## 2018-10-04 ENCOUNTER — Inpatient Hospital Stay (HOSPITAL_COMMUNITY): Payer: Medicare HMO | Admitting: Physician Assistant

## 2018-10-04 ENCOUNTER — Other Ambulatory Visit: Payer: Self-pay

## 2018-10-04 DIAGNOSIS — Z79899 Other long term (current) drug therapy: Secondary | ICD-10-CM | POA: Diagnosis not present

## 2018-10-04 DIAGNOSIS — R7303 Prediabetes: Secondary | ICD-10-CM | POA: Diagnosis present

## 2018-10-04 DIAGNOSIS — Z8541 Personal history of malignant neoplasm of cervix uteri: Secondary | ICD-10-CM | POA: Diagnosis not present

## 2018-10-04 DIAGNOSIS — Z79890 Hormone replacement therapy (postmenopausal): Secondary | ICD-10-CM

## 2018-10-04 DIAGNOSIS — M797 Fibromyalgia: Secondary | ICD-10-CM | POA: Diagnosis present

## 2018-10-04 DIAGNOSIS — Z87891 Personal history of nicotine dependence: Secondary | ICD-10-CM

## 2018-10-04 DIAGNOSIS — F418 Other specified anxiety disorders: Secondary | ICD-10-CM | POA: Diagnosis present

## 2018-10-04 DIAGNOSIS — Z87442 Personal history of urinary calculi: Secondary | ICD-10-CM

## 2018-10-04 DIAGNOSIS — Z800 Family history of malignant neoplasm of digestive organs: Secondary | ICD-10-CM

## 2018-10-04 DIAGNOSIS — Z6834 Body mass index (BMI) 34.0-34.9, adult: Secondary | ICD-10-CM

## 2018-10-04 DIAGNOSIS — G629 Polyneuropathy, unspecified: Secondary | ICD-10-CM | POA: Diagnosis present

## 2018-10-04 DIAGNOSIS — Z8249 Family history of ischemic heart disease and other diseases of the circulatory system: Secondary | ICD-10-CM | POA: Diagnosis not present

## 2018-10-04 DIAGNOSIS — E669 Obesity, unspecified: Secondary | ICD-10-CM | POA: Diagnosis present

## 2018-10-04 DIAGNOSIS — K317 Polyp of stomach and duodenum: Secondary | ICD-10-CM | POA: Diagnosis present

## 2018-10-04 DIAGNOSIS — Z9884 Bariatric surgery status: Secondary | ICD-10-CM

## 2018-10-04 DIAGNOSIS — F4310 Post-traumatic stress disorder, unspecified: Secondary | ICD-10-CM | POA: Diagnosis present

## 2018-10-04 DIAGNOSIS — Z9882 Breast implant status: Secondary | ICD-10-CM

## 2018-10-04 DIAGNOSIS — K210 Gastro-esophageal reflux disease with esophagitis: Principal | ICD-10-CM | POA: Diagnosis present

## 2018-10-04 DIAGNOSIS — Z811 Family history of alcohol abuse and dependence: Secondary | ICD-10-CM | POA: Diagnosis not present

## 2018-10-04 DIAGNOSIS — F329 Major depressive disorder, single episode, unspecified: Secondary | ICD-10-CM | POA: Diagnosis present

## 2018-10-04 DIAGNOSIS — G4709 Other insomnia: Secondary | ICD-10-CM | POA: Diagnosis present

## 2018-10-04 DIAGNOSIS — E7800 Pure hypercholesterolemia, unspecified: Secondary | ICD-10-CM | POA: Diagnosis present

## 2018-10-04 DIAGNOSIS — E785 Hyperlipidemia, unspecified: Secondary | ICD-10-CM | POA: Diagnosis present

## 2018-10-04 DIAGNOSIS — M1990 Unspecified osteoarthritis, unspecified site: Secondary | ICD-10-CM | POA: Diagnosis present

## 2018-10-04 DIAGNOSIS — Z8042 Family history of malignant neoplasm of prostate: Secondary | ICD-10-CM | POA: Diagnosis not present

## 2018-10-04 DIAGNOSIS — Z9851 Tubal ligation status: Secondary | ICD-10-CM

## 2018-10-04 DIAGNOSIS — I10 Essential (primary) hypertension: Secondary | ICD-10-CM | POA: Diagnosis present

## 2018-10-04 DIAGNOSIS — M549 Dorsalgia, unspecified: Secondary | ICD-10-CM | POA: Diagnosis present

## 2018-10-04 DIAGNOSIS — Z803 Family history of malignant neoplasm of breast: Secondary | ICD-10-CM

## 2018-10-04 DIAGNOSIS — J449 Chronic obstructive pulmonary disease, unspecified: Secondary | ICD-10-CM | POA: Diagnosis present

## 2018-10-04 HISTORY — PX: GASTRIC ROUX-EN-Y: SHX5262

## 2018-10-04 LAB — TYPE AND SCREEN
ABO/RH(D): O POS
Antibody Screen: NEGATIVE

## 2018-10-04 LAB — HEMOGLOBIN AND HEMATOCRIT, BLOOD
HCT: 42.1 % (ref 36.0–46.0)
Hemoglobin: 13 g/dL (ref 12.0–15.0)

## 2018-10-04 LAB — PREGNANCY, URINE: Preg Test, Ur: NEGATIVE

## 2018-10-04 SURGERY — LAPAROSCOPIC ROUX-EN-Y GASTRIC BYPASS WITH UPPER ENDOSCOPY
Anesthesia: General

## 2018-10-04 MED ORDER — OXYCODONE HCL 5 MG/5ML PO SOLN
5.0000 mg | ORAL | Status: DC | PRN
  Administered 2018-10-04 – 2018-10-06 (×4): 5 mg via ORAL
  Administered 2018-10-06: 10 mg via ORAL
  Filled 2018-10-04 (×3): qty 5
  Filled 2018-10-04: qty 10
  Filled 2018-10-04: qty 5

## 2018-10-04 MED ORDER — SODIUM CHLORIDE (PF) 0.9 % IJ SOLN
INTRAMUSCULAR | Status: DC | PRN
  Administered 2018-10-04: 50 mL

## 2018-10-04 MED ORDER — SCOPOLAMINE 1 MG/3DAYS TD PT72
1.0000 | MEDICATED_PATCH | TRANSDERMAL | Status: DC
  Administered 2018-10-04: 1.5 mg via TRANSDERMAL

## 2018-10-04 MED ORDER — KETAMINE HCL 10 MG/ML IJ SOLN
INTRAMUSCULAR | Status: AC
  Filled 2018-10-04: qty 1

## 2018-10-04 MED ORDER — MIDAZOLAM HCL 2 MG/2ML IJ SOLN
INTRAMUSCULAR | Status: AC
  Filled 2018-10-04: qty 2

## 2018-10-04 MED ORDER — LORAZEPAM 0.5 MG PO TABS
0.5000 mg | ORAL_TABLET | Freq: Three times a day (TID) | ORAL | Status: DC | PRN
  Administered 2018-10-06: 0.5 mg via ORAL
  Filled 2018-10-04: qty 1

## 2018-10-04 MED ORDER — ENSURE MAX PROTEIN PO LIQD
2.0000 [oz_av] | ORAL | Status: DC
  Administered 2018-10-05 – 2018-10-06 (×9): 2 [oz_av] via ORAL

## 2018-10-04 MED ORDER — APREPITANT 40 MG PO CAPS
ORAL_CAPSULE | ORAL | Status: AC
  Administered 2018-10-04: 40 mg via ORAL
  Filled 2018-10-04: qty 1

## 2018-10-04 MED ORDER — ONDANSETRON HCL 4 MG/2ML IJ SOLN
INTRAMUSCULAR | Status: DC | PRN
  Administered 2018-10-04: 4 mg via INTRAVENOUS

## 2018-10-04 MED ORDER — LIDOCAINE 2% (20 MG/ML) 5 ML SYRINGE
INTRAMUSCULAR | Status: DC | PRN
  Administered 2018-10-04: 1.5 mg/kg/h via INTRAVENOUS

## 2018-10-04 MED ORDER — GABAPENTIN 300 MG PO CAPS
ORAL_CAPSULE | ORAL | Status: AC
  Administered 2018-10-04: 300 mg via ORAL
  Filled 2018-10-04: qty 1

## 2018-10-04 MED ORDER — LIDOCAINE 2% (20 MG/ML) 5 ML SYRINGE
INTRAMUSCULAR | Status: DC | PRN
  Administered 2018-10-04: 80 mg via INTRAVENOUS

## 2018-10-04 MED ORDER — ACETAMINOPHEN 500 MG PO TABS
1000.0000 mg | ORAL_TABLET | ORAL | Status: AC
  Administered 2018-10-04: 1000 mg via ORAL

## 2018-10-04 MED ORDER — SUCCINYLCHOLINE CHLORIDE 200 MG/10ML IV SOSY
PREFILLED_SYRINGE | INTRAVENOUS | Status: DC | PRN
  Administered 2018-10-04: 200 mg via INTRAVENOUS

## 2018-10-04 MED ORDER — LIDOCAINE 2% (20 MG/ML) 5 ML SYRINGE
INTRAMUSCULAR | Status: AC
  Filled 2018-10-04: qty 5

## 2018-10-04 MED ORDER — SCOPOLAMINE 1 MG/3DAYS TD PT72
MEDICATED_PATCH | TRANSDERMAL | Status: AC
  Administered 2018-10-04: 1.5 mg via TRANSDERMAL
  Filled 2018-10-04: qty 1

## 2018-10-04 MED ORDER — EPHEDRINE SULFATE-NACL 50-0.9 MG/10ML-% IV SOSY
PREFILLED_SYRINGE | INTRAVENOUS | Status: DC | PRN
  Administered 2018-10-04: 5 mg via INTRAVENOUS
  Administered 2018-10-04: 10 mg via INTRAVENOUS

## 2018-10-04 MED ORDER — PROMETHAZINE HCL 25 MG/ML IJ SOLN: 12.5000 mg | Freq: Four times a day (QID) | INTRAMUSCULAR | Status: DC | PRN

## 2018-10-04 MED ORDER — TRAZODONE HCL 50 MG PO TABS
50.0000 mg | ORAL_TABLET | Freq: Every evening | ORAL | Status: DC | PRN
  Administered 2018-10-04 – 2018-10-05 (×2): 50 mg via ORAL
  Filled 2018-10-04 (×2): qty 1

## 2018-10-04 MED ORDER — SODIUM CHLORIDE (PF) 0.9 % IJ SOLN
INTRAMUSCULAR | Status: AC
  Filled 2018-10-04: qty 50

## 2018-10-04 MED ORDER — APREPITANT 40 MG PO CAPS
40.0000 mg | ORAL_CAPSULE | ORAL | Status: AC
  Administered 2018-10-04: 40 mg via ORAL

## 2018-10-04 MED ORDER — HEPARIN SODIUM (PORCINE) 5000 UNIT/ML IJ SOLN
INTRAMUSCULAR | Status: AC
  Administered 2018-10-04: 5000 [IU] via SUBCUTANEOUS
  Filled 2018-10-04: qty 1

## 2018-10-04 MED ORDER — GABAPENTIN 100 MG PO CAPS
200.0000 mg | ORAL_CAPSULE | Freq: Two times a day (BID) | ORAL | Status: DC
  Administered 2018-10-04 – 2018-10-05 (×3): 200 mg via ORAL
  Filled 2018-10-04 (×3): qty 2

## 2018-10-04 MED ORDER — SUGAMMADEX SODIUM 200 MG/2ML IV SOLN
INTRAVENOUS | Status: DC | PRN
  Administered 2018-10-04: 200 mg via INTRAVENOUS

## 2018-10-04 MED ORDER — HEPARIN SODIUM (PORCINE) 5000 UNIT/ML IJ SOLN
5000.0000 [IU] | INTRAMUSCULAR | Status: AC
  Administered 2018-10-04: 5000 [IU] via SUBCUTANEOUS

## 2018-10-04 MED ORDER — CHLORHEXIDINE GLUCONATE 4 % EX LIQD: 60.0000 mL | Freq: Once | CUTANEOUS | Status: DC

## 2018-10-04 MED ORDER — PROPRANOLOL HCL 20 MG PO TABS
80.0000 mg | ORAL_TABLET | Freq: Once | ORAL | Status: AC
  Administered 2018-10-04: 80 mg via ORAL
  Filled 2018-10-04: qty 4

## 2018-10-04 MED ORDER — ONDANSETRON HCL 4 MG/2ML IJ SOLN
INTRAMUSCULAR | Status: AC
  Filled 2018-10-04: qty 2

## 2018-10-04 MED ORDER — BUPIVACAINE LIPOSOME 1.3 % IJ SUSP
INTRAMUSCULAR | Status: DC | PRN
  Administered 2018-10-04: 20 mL

## 2018-10-04 MED ORDER — ENOXAPARIN SODIUM 30 MG/0.3ML ~~LOC~~ SOLN
30.0000 mg | Freq: Two times a day (BID) | SUBCUTANEOUS | Status: DC
  Administered 2018-10-04 – 2018-10-05 (×3): 30 mg via SUBCUTANEOUS
  Filled 2018-10-04 (×3): qty 0.3

## 2018-10-04 MED ORDER — PROMETHAZINE HCL 25 MG/ML IJ SOLN
INTRAMUSCULAR | Status: AC
  Administered 2018-10-04: 6.25 mg
  Filled 2018-10-04: qty 1

## 2018-10-04 MED ORDER — ACETAMINOPHEN 500 MG PO TABS
ORAL_TABLET | ORAL | Status: AC
  Administered 2018-10-04: 1000 mg via ORAL
  Filled 2018-10-04: qty 2

## 2018-10-04 MED ORDER — SUCCINYLCHOLINE CHLORIDE 200 MG/10ML IV SOSY
PREFILLED_SYRINGE | INTRAVENOUS | Status: AC
  Filled 2018-10-04: qty 10

## 2018-10-04 MED ORDER — ACETAMINOPHEN 160 MG/5ML PO SOLN
650.0000 mg | Freq: Four times a day (QID) | ORAL | Status: DC
  Administered 2018-10-04 – 2018-10-06 (×6): 650 mg via ORAL
  Filled 2018-10-04 (×6): qty 20.3

## 2018-10-04 MED ORDER — KETAMINE HCL 10 MG/ML IJ SOLN
INTRAMUSCULAR | Status: DC | PRN
  Administered 2018-10-04 (×2): 15 mg via INTRAVENOUS

## 2018-10-04 MED ORDER — PANTOPRAZOLE SODIUM 40 MG IV SOLR
40.0000 mg | Freq: Every day | INTRAVENOUS | Status: DC
  Administered 2018-10-04 – 2018-10-05 (×2): 40 mg via INTRAVENOUS
  Filled 2018-10-04 (×2): qty 40

## 2018-10-04 MED ORDER — EVICEL 5 ML EX KIT
PACK | Freq: Once | CUTANEOUS | Status: AC
  Administered 2018-10-04: 5 mL
  Filled 2018-10-04: qty 1

## 2018-10-04 MED ORDER — HYDROMORPHONE HCL 1 MG/ML IJ SOLN
INTRAMUSCULAR | Status: AC
  Filled 2018-10-04: qty 1

## 2018-10-04 MED ORDER — PHENYLEPHRINE 40 MCG/ML (10ML) SYRINGE FOR IV PUSH (FOR BLOOD PRESSURE SUPPORT)
PREFILLED_SYRINGE | INTRAVENOUS | Status: DC | PRN
  Administered 2018-10-04: 80 ug via INTRAVENOUS

## 2018-10-04 MED ORDER — FENTANYL CITRATE (PF) 100 MCG/2ML IJ SOLN
25.0000 ug | INTRAMUSCULAR | Status: DC | PRN
  Administered 2018-10-04: 50 ug via INTRAVENOUS
  Administered 2018-10-04: 25 ug via INTRAVENOUS
  Administered 2018-10-04: 50 ug via INTRAVENOUS

## 2018-10-04 MED ORDER — METHOCARBAMOL 1000 MG/10ML IJ SOLN
500.0000 mg | Freq: Three times a day (TID) | INTRAVENOUS | Status: DC | PRN
  Filled 2018-10-04: qty 5

## 2018-10-04 MED ORDER — SIMETHICONE 80 MG PO CHEW
80.0000 mg | CHEWABLE_TABLET | Freq: Four times a day (QID) | ORAL | Status: DC | PRN
  Administered 2018-10-04 – 2018-10-06 (×5): 80 mg via ORAL
  Filled 2018-10-04 (×5): qty 1

## 2018-10-04 MED ORDER — 0.9 % SODIUM CHLORIDE (POUR BTL) OPTIME
TOPICAL | Status: DC | PRN
  Administered 2018-10-04: 1000 mL

## 2018-10-04 MED ORDER — SUGAMMADEX SODIUM 200 MG/2ML IV SOLN
INTRAVENOUS | Status: AC
  Filled 2018-10-04: qty 2

## 2018-10-04 MED ORDER — FENTANYL CITRATE (PF) 250 MCG/5ML IJ SOLN
INTRAMUSCULAR | Status: AC
  Filled 2018-10-04: qty 5

## 2018-10-04 MED ORDER — GABAPENTIN 300 MG PO CAPS
300.0000 mg | ORAL_CAPSULE | ORAL | Status: AC
  Administered 2018-10-04: 300 mg via ORAL

## 2018-10-04 MED ORDER — ONDANSETRON HCL 4 MG/2ML IJ SOLN
4.0000 mg | Freq: Four times a day (QID) | INTRAMUSCULAR | Status: DC | PRN
  Administered 2018-10-05: 4 mg via INTRAVENOUS
  Filled 2018-10-04: qty 2

## 2018-10-04 MED ORDER — MIDAZOLAM HCL 5 MG/5ML IJ SOLN
INTRAMUSCULAR | Status: DC | PRN
  Administered 2018-10-04: 1 mg via INTRAVENOUS

## 2018-10-04 MED ORDER — SODIUM CHLORIDE 0.9 % IV SOLN
INTRAVENOUS | Status: AC
  Filled 2018-10-04: qty 2

## 2018-10-04 MED ORDER — LACTATED RINGERS IR SOLN
Status: DC | PRN
  Administered 2018-10-04: 1000 mL

## 2018-10-04 MED ORDER — DEXAMETHASONE SODIUM PHOSPHATE 10 MG/ML IJ SOLN
INTRAMUSCULAR | Status: AC
  Filled 2018-10-04: qty 1

## 2018-10-04 MED ORDER — SODIUM CHLORIDE 0.9 % IV SOLN
2.0000 g | INTRAVENOUS | Status: AC
  Administered 2018-10-04: 2 g via INTRAVENOUS

## 2018-10-04 MED ORDER — KCL IN DEXTROSE-NACL 20-5-0.45 MEQ/L-%-% IV SOLN
INTRAVENOUS | Status: DC
  Administered 2018-10-04 – 2018-10-06 (×5): via INTRAVENOUS
  Filled 2018-10-04 (×5): qty 1000

## 2018-10-04 MED ORDER — ROCURONIUM BROMIDE 100 MG/10ML IV SOLN
INTRAVENOUS | Status: AC
  Filled 2018-10-04: qty 1

## 2018-10-04 MED ORDER — FENTANYL CITRATE (PF) 100 MCG/2ML IJ SOLN
INTRAMUSCULAR | Status: AC
  Filled 2018-10-04: qty 4

## 2018-10-04 MED ORDER — FENTANYL CITRATE (PF) 100 MCG/2ML IJ SOLN
INTRAMUSCULAR | Status: DC | PRN
  Administered 2018-10-04 (×2): 50 ug via INTRAVENOUS

## 2018-10-04 MED ORDER — APREPITANT 40 MG PO CAPS
ORAL_CAPSULE | ORAL | Status: AC
  Filled 2018-10-04: qty 1

## 2018-10-04 MED ORDER — LIDOCAINE 2% (20 MG/ML) 5 ML SYRINGE
INTRAMUSCULAR | Status: AC
  Filled 2018-10-04: qty 10

## 2018-10-04 MED ORDER — MORPHINE SULFATE (PF) 2 MG/ML IV SOLN
1.0000 mg | INTRAVENOUS | Status: DC | PRN
  Administered 2018-10-04: 1 mg via INTRAVENOUS
  Filled 2018-10-04: qty 1

## 2018-10-04 MED ORDER — PHENYLEPHRINE 40 MCG/ML (10ML) SYRINGE FOR IV PUSH (FOR BLOOD PRESSURE SUPPORT)
PREFILLED_SYRINGE | INTRAVENOUS | Status: AC
  Filled 2018-10-04: qty 10

## 2018-10-04 MED ORDER — DEXAMETHASONE SODIUM PHOSPHATE 4 MG/ML IJ SOLN: 4.0000 mg | INTRAMUSCULAR | Status: DC

## 2018-10-04 MED ORDER — PROPOFOL 10 MG/ML IV BOLUS
INTRAVENOUS | Status: DC | PRN
  Administered 2018-10-04: 150 mg via INTRAVENOUS

## 2018-10-04 MED ORDER — ROCURONIUM BROMIDE 10 MG/ML (PF) SYRINGE
PREFILLED_SYRINGE | INTRAVENOUS | Status: DC | PRN
  Administered 2018-10-04: 50 mg via INTRAVENOUS
  Administered 2018-10-04 (×2): 10 mg via INTRAVENOUS

## 2018-10-04 MED ORDER — PROPOFOL 10 MG/ML IV BOLUS
INTRAVENOUS | Status: AC
  Filled 2018-10-04: qty 20

## 2018-10-04 MED ORDER — LACTATED RINGERS IV SOLN
INTRAVENOUS | Status: DC
  Administered 2018-10-04: 09:00:00 via INTRAVENOUS

## 2018-10-04 MED ORDER — DEXAMETHASONE SODIUM PHOSPHATE 10 MG/ML IJ SOLN
INTRAMUSCULAR | Status: DC | PRN
  Administered 2018-10-04: 10 mg via INTRAVENOUS

## 2018-10-04 MED ORDER — HYDROMORPHONE HCL 1 MG/ML IJ SOLN
0.2500 mg | INTRAMUSCULAR | Status: DC | PRN
  Administered 2018-10-04: 0.25 mg via INTRAVENOUS

## 2018-10-04 MED ORDER — EPHEDRINE 5 MG/ML INJ
INTRAVENOUS | Status: AC
  Filled 2018-10-04: qty 10

## 2018-10-04 SURGICAL SUPPLY — 77 items
APPLICATOR COTTON TIP 6 STRL (MISCELLANEOUS) IMPLANT
APPLICATOR COTTON TIP 6IN STRL (MISCELLANEOUS) IMPLANT
APPLIER CLIP ROT 13.4 12 LRG (CLIP)
BANDAGE ADH SHEER 1  50/CT (GAUZE/BANDAGES/DRESSINGS) ×18 IMPLANT
BENZOIN TINCTURE PRP APPL 2/3 (GAUZE/BANDAGES/DRESSINGS) ×3 IMPLANT
BLADE SURG SZ11 CARB STEEL (BLADE) ×3 IMPLANT
CABLE HIGH FREQUENCY MONO STRZ (ELECTRODE) IMPLANT
CHLORAPREP W/TINT 26ML (MISCELLANEOUS) ×6 IMPLANT
CLIP APPLIE ROT 13.4 12 LRG (CLIP) IMPLANT
CLIP SUT LAPRA TY ABSORB (SUTURE) ×6 IMPLANT
CLOSURE WOUND 1/2 X4 (GAUZE/BANDAGES/DRESSINGS) ×1
COVER WAND RF STERILE (DRAPES) IMPLANT
CUTTER FLEX LINEAR 45M (STAPLE) IMPLANT
DEVICE SUT QUICK LOAD TK 5 (STAPLE) IMPLANT
DEVICE SUT TI-KNOT TK 5X26 (MISCELLANEOUS) IMPLANT
DEVICE SUTURE ENDOST 10MM (ENDOMECHANICALS) ×3 IMPLANT
DEVICE TI KNOT TK5 (MISCELLANEOUS)
DRAIN PENROSE 18X1/4 LTX STRL (WOUND CARE) ×3 IMPLANT
ELECT REM PT RETURN 15FT ADLT (MISCELLANEOUS) ×3 IMPLANT
GAUZE 4X4 16PLY RFD (DISPOSABLE) ×3 IMPLANT
GAUZE SPONGE 4X4 12PLY STRL (GAUZE/BANDAGES/DRESSINGS) IMPLANT
GLOVE BIO SURGEON STRL SZ7.5 (GLOVE) IMPLANT
GLOVE INDICATOR 8.0 STRL GRN (GLOVE) ×3 IMPLANT
GOWN STRL REUS W/TWL XL LVL3 (GOWN DISPOSABLE) ×12 IMPLANT
HOVERMATT SINGLE USE (MISCELLANEOUS) ×3 IMPLANT
KIT BASIN OR (CUSTOM PROCEDURE TRAY) ×3 IMPLANT
KIT GASTRIC LAVAGE 34FR ADT (SET/KITS/TRAYS/PACK) ×3 IMPLANT
MARKER SKIN DUAL TIP RULER LAB (MISCELLANEOUS) ×3 IMPLANT
NDL SPNL 22GX3.5 QUINCKE BK (NEEDLE) ×1 IMPLANT
NEEDLE SPNL 22GX3.5 QUINCKE BK (NEEDLE) ×3 IMPLANT
PACK CARDIOVASCULAR III (CUSTOM PROCEDURE TRAY) ×1 IMPLANT
QUICK LOAD TK 5 (STAPLE)
RELOAD 45 VASCULAR/THIN (ENDOMECHANICALS) IMPLANT
RELOAD ENDO STITCH 2.0 (ENDOMECHANICALS) ×16
RELOAD STAPLE 45 2.5 WHT GRN (ENDOMECHANICALS) IMPLANT
RELOAD STAPLE 45 3.5 BLU ETS (ENDOMECHANICALS) IMPLANT
RELOAD STAPLE 60 2.6 WHT THN (STAPLE) ×2 IMPLANT
RELOAD STAPLE 60 3.6 BLU REG (STAPLE) ×2 IMPLANT
RELOAD STAPLE 60 3.8 GOLD REG (STAPLE) ×1 IMPLANT
RELOAD STAPLE TA45 3.5 REG BLU (ENDOMECHANICALS) IMPLANT
RELOAD STAPLER BLUE 60MM (STAPLE) ×2 IMPLANT
RELOAD STAPLER GOLD 60MM (STAPLE) ×1 IMPLANT
RELOAD STAPLER WHITE 60MM (STAPLE) ×2 IMPLANT
RELOAD SUT SNGL STCH ABSRB 2-0 (ENDOMECHANICALS) ×4 IMPLANT
RELOAD SUT SNGL STCH BLK 2-0 (ENDOMECHANICALS) ×4 IMPLANT
SCISSORS LAP 5X45 EPIX DISP (ENDOMECHANICALS) ×3 IMPLANT
SET IRRIG TUBING LAPAROSCOPIC (IRRIGATION / IRRIGATOR) ×3 IMPLANT
SET TUBE SMOKE EVAC HIGH FLOW (TUBING) ×3 IMPLANT
SHEARS HARMONIC ACE PLUS 45CM (MISCELLANEOUS) ×3 IMPLANT
SLEEVE XCEL OPT CAN 5 100 (ENDOMECHANICALS) ×3 IMPLANT
SOLUTION ANTI FOG 6CC (MISCELLANEOUS) ×3 IMPLANT
STAPLER ECHELON BIOABSB 60 FLE (MISCELLANEOUS) IMPLANT
STAPLER ECHELON LONG 60 440 (INSTRUMENTS) ×3 IMPLANT
STAPLER RELOAD BLUE 60MM (STAPLE) ×6
STAPLER RELOAD GOLD 60MM (STAPLE) ×3
STAPLER RELOAD WHITE 60MM (STAPLE) ×6
STRIP CLOSURE SKIN 1/2X4 (GAUZE/BANDAGES/DRESSINGS) ×2 IMPLANT
SURGILUBE 2OZ TUBE FLIPTOP (MISCELLANEOUS) ×3 IMPLANT
SUT MNCRL AB 4-0 PS2 18 (SUTURE) ×3 IMPLANT
SUT RELOAD ENDO STITCH 2 48X1 (ENDOMECHANICALS) ×4
SUT RELOAD ENDO STITCH 2.0 (ENDOMECHANICALS) ×4
SUT SURGIDAC NAB ES-9 0 48 120 (SUTURE) IMPLANT
SUT VIC AB 2-0 SH 27 (SUTURE) ×2
SUT VIC AB 2-0 SH 27X BRD (SUTURE) ×1 IMPLANT
SUTURE RELOAD END STTCH 2 48X1 (ENDOMECHANICALS) ×4 IMPLANT
SUTURE RELOAD ENDO STITCH 2.0 (ENDOMECHANICALS) ×4 IMPLANT
SYR 10ML ECCENTRIC (SYRINGE) ×3 IMPLANT
SYR 20CC LL (SYRINGE) ×6 IMPLANT
TIP RIGID 35CM EVICEL (HEMOSTASIS) ×3 IMPLANT
TOWEL OR 17X26 10 PK STRL BLUE (TOWEL DISPOSABLE) ×3 IMPLANT
TOWEL OR NON WOVEN STRL DISP B (DISPOSABLE) ×3 IMPLANT
TRAY FOLEY CATH 14FRSI W/METER (CATHETERS) ×2 IMPLANT
TROCAR BLADELESS OPT 5 100 (ENDOMECHANICALS) ×3 IMPLANT
TROCAR UNIVERSAL OPT 12M 100M (ENDOMECHANICALS) ×9 IMPLANT
TROCAR XCEL 12X100 BLDLESS (ENDOMECHANICALS) ×3 IMPLANT
TUBING CONNECTING 10 (TUBING) ×4 IMPLANT
TUBING CONNECTING 10' (TUBING) ×2

## 2018-10-04 NOTE — Progress Notes (Signed)
PHARMACY CONSULT FOR:  Risk Assessment for Post-Discharge VTE Following Bariatric Surgery  Post-Discharge VTE Risk Assessment: This patient's probability of 30-day post-discharge VTE is increased due to the factors marked:   Female    Age >/=60 years    BMI >/=50 kg/m2    CHF    Dyspnea at Rest    Paraplegia    Non-gastric-band surgery    Operation Time >/=3 hr    Return to OR     Length of Stay >/= 3 d   Predicted probability of 30-day post-discharge VTE: 0.07%  Other patient-specific factors to consider:  Recommendation for Discharge: No pharmacologic prophylaxis post-discharge  Nicole [name] is a 43 y.o. female with hx sleeve gastrectomy who underwent conversion to gastric bypass on 10/04/18   Case start: 11:43 Case end: 13:43   Allergies  Allergen Reactions  . Codeine Itching, Nausea And Vomiting and Nausea Only    Patient Measurements: Height: 5' (152.4 cm) Weight: 175 lb 2 oz (79.4 kg) IBW/kg (Calculated) : 45.5 Body mass index is 34.2 kg/m.  No results for input(s): WBC, HGB, HCT, PLT, APTT, CREATININE, LABCREA, CREATININE, CREAT24HRUR, MG, PHOS, ALBUMIN, PROT, ALBUMIN, AST, ALT, ALKPHOS, BILITOT, BILIDIR, IBILI in the last 72 hours. Estimated Creatinine Clearance: 83.7 mL/min (by C-G formula based on SCr of 0.72 mg/dL).    Past Medical History:  Diagnosis Date  . Anemia   . Anxiety   . Arthritis   . Blood transfusion without reported diagnosis   . Cancer ([place])    cervical   . Chronic kidney disease    kideny stones multi   . COPD (chronic obstructive pulmonary disease) (HCC)    hx of   . Fibromyalgia   . GERD (gastroesophageal reflux disease)    severe, wakes her at night   . Headache    hx of   . History of kidney stones   . Hx of migraines   . Hyperlipidemia   . Hypertension   . Insomnia   . Neuromuscular disorder (HCC)    neuropathy  . Obesity   . PONV (postoperative nausea and vomiting)   . Pre-diabetes   . Spinal headache    post  epidural      Medications Prior to Admission  Medication Sig Dispense Refill Last Dose  . acetaminophen (TYLENOL) 325 MG tablet Take by mouth.   10/03/2018 at Unknown time  . calcium elemental as carbonate (TUMS ULTRA 1000) 400 MG chewable tablet Chew 1,000 mg by mouth as needed for heartburn (Pt states she takes up tp 8 times daily as needed).   10/03/2018 at Unknown time  . carisoprodol (SOMA) 350 MG tablet Take 350 mg by mouth 2 (two) times daily as needed for muscle spasms.   10/03/2018 at Unknown time  . cephALEXin (KEFLEX) 500 MG capsule Take 500 mg by mouth 2 (two) times daily as needed (UTI Prevention).   10/03/2018 at Unknown time  . Lansoprazole (PREVACID PO) Take by mouth.   09/20/2018  . LORazepam (ATIVAN) 1 MG tablet Take 1 mg by mouth every 8 (eight) hours as needed for anxiety.   10/04/2018 at [place]  . Magnesium 500 MG TABS Take 1,000 mg by mouth at bedtime.    10/03/2018 at Unknown time  . Melatonin 10 MG TBDP Take 10-20 mg by mouth at bedtime.    10/03/2018 at Unknown time  . meperidine (DEMEROL) 50 MG tablet Take 50 mg by mouth every 6 (six) hours as needed for severe pain.  Past Month at Unknown time  . omeprazole (PRILOSEC) 40 MG capsule Take 40 mg by mouth daily.   10/03/2018 at Unknown time  . pantoprazole (PROTONIX) 40 MG tablet Take 1 tablet (40 mg total) by mouth 2 (two) times daily. 180 tablet 3 10/04/2018 at 0645  . phentermine (ADIPEX-P) 37.5 MG tablet Take 37.5 mg by mouth every other day. Patient states had not had it for 2 weeks   Past Month at Unknown time  . Prenatal Vit-Fe Fumarate-FA (PRENATAL PO) Take 1 tablet by mouth 2 (two) times daily.   10/03/2018 at Unknown time  . propranolol (INDERAL) 80 MG tablet Take 80 mg by mouth 2 (two) times daily. Patient states has only been taking medication once daily at bedtime   10/04/2018 at 0645  . raNITIdine HCl (ZANTAC PO) Take by mouth.   09/20/2018  . traZODone (DESYREL) 50 MG tablet Take 50 mg by mouth at bedtime.   10/03/2018 at  Unknown time  . vitamin B-12 (CYANOCOBALAMIN) 500 MCG tablet Take 500 mcg by mouth daily.   10/03/2018 at Unknown time  . diphenhydrAMINE (BENADRYL) 50 MG capsule Take 50-100 mg by mouth at bedtime.    More than a month at Unknown time  . metoCLOPramide (REGLAN) 10 MG tablet Take 10 mg by mouth every 6 (six) hours as needed (for migraine).   More than a month at Unknown time  . ondansetron (ZOFRAN) 4 MG tablet Take 4 mg by mouth every 8 (eight) hours as needed for nausea or vomiting.   More than a month at Unknown time  . prochlorperazine (COMPAZINE) 10 MG tablet Take 10 mg by mouth every 6 (six) hours as needed (for migraine).   More than a month at Unknown time  . zolpidem (AMBIEN) 10 MG tablet Take 10 mg by mouth at bedtime as needed for sleep.   More than a month at Unknown time    Peggyann Juba, PharmD, BCPS Pager: [phone] 10/04/2018,4:04 PM

## 2018-10-04 NOTE — Op Note (Signed)
Nicole [name] [id number] 1976-06-10. 10/04/2018  Preoperative diagnosis:  1. Refractory GERD 2. H/o sleeve gastrectomy 3. Obesity  Postoperative  diagnosis:  1. 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 FACS  Asst.: Excell Seltzer MD FACS  Anesthesia: General plus exparel/marcaine mix  Complications: None   EBL: <25 cc   Drains: None   Disposition: PACU in good condition   Indications for procedure: 43 y.o. yo female with morbid obesity who underwent sleeve gastrectomy in October 2018 who unfortunately has had persistent GERD despite maximal medical therapy and offered a conversion to gastric bypass for relief of her GERD. The patient's comorbidities are listed above. We discussed the risk and benefits of surgery including but not limited to anesthesia risk, bleeding, infection, blood clot formation, anastomotic leak, anastomotic stricture, ulcer formation, death, respiratory complications, intestinal blockage, internal hernia, gallstone formation, vitamin and nutritional deficiencies, injury to surrounding structures, failure to lose weight and mood changes.   Description of procedure: Patient is brought to the operating room and general anesthesia induced. The patient had received preoperative broad-spectrum IV antibiotics and subcutaneous heparin. The abdomen was widely sterilely prepped with Chloraprep and draped. Patient timeout was performed and correct patient and procedure confirmed. Access was obtained with a 5 mm Optiview trocar in the left upper quadrant thru old trocar site and pneumoperitoneum established without difficulty. Under direct vision 12 mm trocars were placed laterally in the right upper quadrant, right upper quadrant midclavicular line, and to the left and above the umbilicus for the camera port after taking down some omental  adhesions from the RUQ and umbilical care. A 5 mm trocar was placed laterally in the left upper quadrant.  Exparel/marcaine mix was infiltrated in bilateral lateral abdominal walls as a TAP block.  Because of her prior sleeve gastrectomy, I decided to create the gastric pouch first.    The patient was then placed in steep reversed Trendelenburg. Through a 5 mm subxiphoid site the [facility] retractor was placed and the left lobe of the liver elevated with excellent exposure of the upper stomach and hiatus. There was no overt evidence of a hiatal hernia and her preop UGI showed no hiatal hernia. The sleeve itself appeared normal. There was no redundant fundus left.  A 5 cm gastric pouch was then carefully measured along the lesser curve of the stomach. Dissection was carried along the lesser curve at this point with the Harmonic scalpel working carefully back toward the lesser sac at right angles to the lesser curve. The free lesser sac was then entered. After being sure all tubes were removed from the stomach an initial firing of the gold load 60 mm linear stapler was fired at right angles across the lesser curve for about 4 cm. There was about 0.5cm stomach/sleeve that hadn't been stapled thru so another gold load was fired. Thus creating a small tubular gastric pouch.  We then returned to do the J-J. Patient placed supine.     The omentum was brought into the upper abdomen and the transverse mesocolon elevated and the ligament of Treitz clearly identified. A 40 cm biliopancreatic limb was then carefully measured from the ligament of Treitz. The small intestine was divided at this point with a single firing of the white load linear stapler. A Penrose drain was sutured to the end of the Roux-en-Y limb for later identification. A 100 cm Roux-en-Y limb was then carefully measured.  At this point a side-to-side anastomosis was created between the Roux limb and the end of the biliopancreatic limb. This was  accomplished with a single firing of the 60 mm white load linear stapler. The common enterotomy was closed with a running 2-0 Vicryl begun at either end of the enterotomy and tied centrally. Eviceal tissue sealant was placed over the anastomosis. The mesenteric defect was then closed with running 2-0 silk. The omentum was then divided with the harmonic scalpel up towards the transverse colon to allow mobility of the Roux limb toward the gastric pouch.  The Roux limb was then brought up in an antecolic fashion with the candycane facing to the patient's left without undue tension. The gastrojejunostomy was created with an initial posterior row of 2-0 Vicryl between the Roux limb and the staple line of the gastric pouch. Enterotomies were then made in the gastric pouch and the Roux limb with the harmonic scalpel and at approximately 2-2-1/2 cm anastomosis was created with a single firing of the 4mm blue load linear stapler. The staple line was inspected and was intact without bleeding. The common enterotomy was then closed with running 2-0 Vicryl begun at either end and tied centrally. The Ewall tube was then easily passed through the anastomosis and an outer anterior layer of running 2-0 Vicryl was placed. The Ewald tube was removed. With the outlet of the gastrojejunostomy clamped and under saline irrigation the assistant performed upper endoscopy and with the gastric pouch tensely distended with air-there was no evidence of leak on this test. The pouch was desufflated. The Terance Hart defect was closed with running 2-0 silk. The abdomen was inspected for any evidence of bleeding or bowel injury and everything looked fine. The Nathanson retractor was removed under direct vision after coating the anastomosis with Eviceal tissue sealant. All CO2 was evacuated and trochars removed. Skin incisions were closed with 4-0 monocryl in a subcuticular fashion followed by benzoin, steri-strips and bandages. Sponge needle and  instrument counts were correct. The patient was taken to the PACU in good condition.    Leighton Ruff. Redmond Pulling, MD, FACS General, Bariatric, & Minimally Invasive Surgery [facility], [place]

## 2018-10-04 NOTE — Transfer of Care (Signed)
Immediate Anesthesia Transfer of Care Note  Patient: Nicole [name]  Procedure(s) Performed: LAPAROSCOPIC ROUX-EN-Y GASTRIC BYPASS WITH UPPER ENDOSCOPY AND ERAS PATHWAY (CONVERSION FROM SLEEVE TO RNY) (N/A )  Patient Location: PACU  Anesthesia Type:General  Level of Consciousness: awake, alert  and oriented  Airway & Oxygen Therapy: Patient Spontanous Breathing and Patient connected to face mask oxygen  Post-op Assessment: Report given to RN, Post -op Vital signs reviewed and stable and Patient moving all extremities X 4  Post vital signs: Reviewed and stable  Last Vitals:  Vitals Value Taken Time  BP 103/82 10/04/2018  1:54 PM  Temp    Pulse 74 10/04/2018  1:57 PM  Resp 20 10/04/2018  1:57 PM  SpO2 97 % 10/04/2018  1:57 PM  Vitals shown include unvalidated device data.  Last Pain:  Vitals:   10/04/18 0903  TempSrc:   PainSc: 5       Patients Stated Pain Goal: 4 (00/45/99 7741)  Complications: No apparent anesthesia complications

## 2018-10-04 NOTE — Anesthesia Procedure Notes (Signed)
Procedure Name: Intubation Date/Time: 10/04/2018 11:16 AM Performed by: Victoriano Lain, CRNA Pre-anesthesia Checklist: Patient identified, Emergency Drugs available, Suction available, Patient being monitored and Timeout performed Patient Re-evaluated:Patient Re-evaluated prior to induction Oxygen Delivery Method: Circle system utilized Preoxygenation: Pre-oxygenation with 100% oxygen Induction Type: Rapid sequence and Cricoid Pressure applied Laryngoscope Size: Glidescope and 3 Grade View: Grade I Tube type: Oral Tube size: 7.5 mm Number of attempts: 2 Airway Equipment and Method: Stylet and Video-laryngoscopy Placement Confirmation: ETT inserted through vocal cords under direct vision,  positive ETCO2 and breath sounds checked- equal and bilateral Secured at: 22 cm Tube secured with: Tape Dental Injury: Teeth and Oropharynx as per pre-operative assessment  Difficulty Due To: Difficulty was unanticipated Comments: Smooth RSI with cricoid pressure by Dr Lanetta Inch. DL X 1 by CRNA. Grade 3 view. DL X 1 by Dr Lanetta Inch. Grade 3 view. VSS. Dl X 1 with Glidescope #3 by Dr Lanetta Inch. Grade 1 view. +ETCO2, BBS =.  ATOI. ETT secured at 22 cm at the lip.

## 2018-10-04 NOTE — Op Note (Signed)
Upper GI endoscopy is performed at the completion of laparoscopic Roux-en-Y gastric bypass by Dr. Redmond Pulling. The Olympus video endoscope was inserted into the upper esophagus and then passed under direct vision to the EG junction. The small gastric pouch was insufflated with air while the gastric outlet was clamped under irrigation by the operating surgeon. There was no evidence of leak. The anastomosis was visualized and was patent. Suture and staple lines were intact and without bleeding. The pouch was tubular and measured 5 cm in length. At the completion of the procedure the pouch was desufflated and the scope withdrawn.

## 2018-10-04 NOTE — Progress Notes (Signed)
Patient reached 2500 on incentive spirometer, she has urinated, walked and drank 2 oz of water. Patient complains of gas but no nausea. Will continue to monitor.

## 2018-10-04 NOTE — Progress Notes (Signed)
Dr. Lanetta Inch notified that pt has ring on left hand that is unable to be removed.  Pt verbalized understanding of possible swelling and burns.

## 2018-10-04 NOTE — H&P (Signed)
Nicole [name] Documented: 07/15/2018 11:28 AM Location: [facility] Patient #: [id number] DOB: July 09, 1976 Married / Language: [name] / Race: White Female   History of Present Illness Randall Hiss M. Colan Laymon MD; 07/15/2018 2:45 PM) The patient is a 43 year old female presenting status-post bariatric surgery. Patient comes in today for additional follow-up and primarily to discuss conversion to gastric bypass. She denies any medical changes since I saw her a few weeks ago. She is still having significant issues with heartburn and reflux. She is taking Protonix, Zantac and Prilosec and Tums daily. It is worse if laying supine. She is altered her eating habits. Despite not eating or drinking close to bedtime she will still have significant heartburn at night. She is still having to sleep an incline. Her upper endoscopy showed esophagitis. She denies any palpitations or shortness of breath. She denies any productive cough.   06/18/2018 She comes in for follow-up after undergoing laparoscopic sleeve gastrectomy on July 13, 2017. Her initial visit weight was 212 pounds. Her preoperative weight was 221 pounds. She was last seen March 14 and her weight was 189.8 pounds. She was having significant issues with heartburn and reflux at her last visit. We ordered an upper GI which showed reflux as well as delayed passage of a barium tablet at the GE junction. I referred her to GI medicine for upper endoscopy. She is found to have LA grade a esophagitis. There was one benign-appearing intrinsic mild stenosis 34 cm from the incisors. It measured 1 cm. It was traversed. It was dilated with the balloon serially up to a maximum of 20 mm. There was some mucosal disruption after dilation. The sleeve gastrectomy itself appeared normal except at some mild erythema. Biopsies were taken. She had a few polyps in the gastric fundus which were removed. In the upper small intestine looked normal.  She states that she really couldn't tell any difference after the dilation. She is still having significant heartburn. It worsens throughout the day. It worsens after eating. She cannot lay down within 3 hours of eating solids or else she will have some liquid come back up. She is still sleeping incline. She is getting in more than 80 ounces of liquid per day. Most of her protein is liquid. The GI doctor did change her to twice a day Protonix and Zantac at night. This really has not helped her reflux or indigestion. Pills go down okay however she can only take 1 pill at a time. She has a sensation that there is acid always wanting to come up. She generally stops eating early due to heartburn.    11/2017 She comes in for follow-up after undergoing laparoscopic sleeve gastrectomy on July 13 2017. Her initial visit weight was 212 pounds. Her preoperative weight was 221 pounds. Last visit was January 19. Her weight at time was 194 pounds. She denies any nausea, vomiting or echoes. But she is having fairly frequent issues with heartburn. She has been taking Prilosec, Protonix and Carafate. She believes the Carafate is helping some. She states that she has not been eating close to bedtime which has helped her symptoms at night. She no longer has anything coming up at night. But she still has heartburn issues during the daytime. She states that at times it feels like she is "breathing fire ". She also describes a metallic taste. She is also chewing a lot of Tums. She denies any epigastric or abdominal pain. She feels that she is hungry all the  time. She is doing protein shakes at breakfast and lunch and then solid food the rest of the day. Interestingly she states that if she eats something solid for breakfast but she feels that she will be more hungry during the daytime. She is frustrated that her weight is not coming off more rapidly. She states that she did go on a cruise and ate  what she wanted including cream brulee daily and lost 7 pounds while she was on a cruise. She is also having continued sleep irregularities. She states that she has had chronic sleep problems for many years. She states that she has had sleep studies in the past which showed no evidence of sleep apnea but more issues with insomnia. She states that she has a history of PTSD and anxiety. She states that during the daytime she is okay but as nighttime approaches she gets very anxious about sleep. She states that the trazodone helps. She states that she has done numerous things every years to help with her anxiety and sleep behavior issues but nothing really has helped. She is no longer on Lexapro or HCTZ. She is using the elliptical for 20 minutes at a time about 3 times a week. She is taking her multivitamin and calcium as well as vitamin D. She is having daily bowel movements.    Problem List/Past Medical 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  We discussed laparoscopic Roux-en-Y gastric bypass. We discussed the preoperative, operative and postoperative process. Using diagrams, I explained the surgery in detail including the performance of an EGD near the end of the surgery. We discussed the typical hospital course including a 2-3 day stay baring any complications. The patient was given educational material. I quoted the patient that they can expect to lose 50-70% of their excess weight with the gastric bypass. We did discuss the possibility of weight regain several years after the procedure.  We discussed the risk and benefits of surgery including but not limited to anesthesia risk, bleeding,  infection, anastomotic edema requiring a few additional days in the hospital, postop nausea, possible conversion to open procedure, blood clot formation, anastomotic leak, anastomotic stricture, ulcer formation, death, respiratory complications, intestinal blockage, internal hernia, gallstone formation, vitamin and nutritional deficiencies, hair loss, weight regain injury to surrounding structures, failure to lose weight and mood changes.  we did discuss that since she is a revisional case that she is at higher risk for injury to surrounding structures, leak, bleeding. We discussed that revisional surgery patient sometimes have a longer length of stay and readmission rate. We discussed that sometimes reflux and heartburn issues can persist although this is unlikely. Total discussion length 30 minutes GERD WITHOUT ESOPHAGITIS (K21.9)   Past Surgical History [name], MD; 07/15/2018 4:19 PM) Breast Augmentation  Bilateral. Knee Surgery  Left. Oral Surgery  Spinal Surgery - Neck   Diagnostic Studies History [name], MD; 07/15/2018 4:19 PM) Colonoscopy  5-10 years ago Mammogram  1-3 years ago Pap Smear  1-5 years ago  Allergies [name], MD; 07/15/2018 4:19 PM) Codeine Phosphate *ANALGESICS - OPIOID*  Itching, Nausea.  Medication History [name], MD; 07/15/2018 4:19 PM) traZODone HCl (50MG  Tablet, Oral) Active. Carafate (1GM Tablet, Oral) Active. Protonix (  40MG  Tablet DR, 1 (one) Oral daily, Taken starting 05/20/2018) Active. Soma (350MG  Tablet, Oral) Active. Zantac (150MG  Tablet, Oral) Active. PriLOSEC (20MG  Capsule DR, Oral) Active. Inderal (10MG  Tablet, Oral) Active. Escitalopram Oxalate (10MG  Tablet, Oral) Active. Omeprazole (40MG  Capsule DR, Oral) Active. Melatonin ER (10MG  Tablet ER, Oral) Active. Magnesium (500MG  Tablet, Oral) Active. Probiotic Daily (Oral) Active. Calcium (Oral) Specific strength unknown - Active. Medications  Reconciled Phentermine HCl (37.5MG  Capsule, 1 (one) Oral every morning, Taken starting 07/15/2018) Active.  Social History [name], MD; 07/15/2018 4:19 PM) Alcohol use  Occasional alcohol use. Caffeine use  Carbonated beverages, Coffee. No drug use  Tobacco use  Former smoker.  Family History [name], MD; 07/15/2018 4:19 PM) Alcohol Abuse  Brother, Family Members In General, Father, Mother. Breast Cancer  Mother. Colon Cancer  Father. Heart disease in female family member before age 21  Heart disease in female family member before age 16  Hypertension  Father. Migraine Headache  Daughter. Prostate Cancer  Father.  Pregnancy / Birth History [name], MD; 07/15/2018 4:19 PM) Age at menarche  80 years. Contraceptive History  Depo-provera, Intrauterine device, Oral contraceptives. Gravida  4 Irregular periods  Length (months) of breastfeeding  7-12 Maternal age  72-20 Para  4  Other Problems 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 Grizelda Piscopo MD; 07/15/2018 2:46 PM) All other systems negative  Vitals (Armen Ferguson CMA; 07/15/2018 11:29 AM) 07/15/2018 11:29 AM Weight: 168.38 lb Height: 60in Body Surface Area: 1.73 m Body Mass Index: 32.88 kg/m  Temp.: 7F  Pulse: 61 (Regular)  P.OX: 99% (Room air) BP: 112/78 (Sitting, Left Arm, Standard)       Physical Exam Randall Hiss M. Illa Enlow MD; 07/15/2018 2:38 PM) General Mental Status-Alert. General Appearance-Consistent with stated age. Hydration-Well hydrated. Voice-Normal.  Eye Eyeball - Bilateral-Normal. Sclera/Conjunctiva  - Bilateral-No scleral icterus.  ENMT Ears -Note: normal ext ears.  Mouth and Throat -Note: lips intact.   Chest and Lung Exam Chest and lung exam reveals -quiet, even and easy respiratory effort with no use of accessory muscles. Inspection Chest Wall - Normal. Back - normal.  Breast - Did not examine.  Neurologic Neurologic evaluation reveals -alert and oriented x 3 with no impairment of recent or remote memory. Mental Status-Normal.  Neuropsychiatric The patient's mood and affect are described as -normal. Judgment and Insight-insight is appropriate concerning matters relevant to self. Speech-not slow, no stammering.  Musculoskeletal Normal Exam - Left-Upper Extremity Strength Normal and Lower Extremity Strength Normal. Normal Exam - Right-Upper Extremity Strength Normal and Lower Extremity Strength Normal.  Lymphatic Axillary - Did not examine. Femoral & Inguinal - Did not examine.    Assessment & Plan Randall Hiss M. Sergey Ishler MD; 07/15/2018 4:19 PM) OBESITY (BMI 30-39.9) (E66.9) Story: Because of her persistent reflux and heartburn despite maximal medical treatment, I think the patient's best option is conversion to a Laparoscopic Roux-en-Y Gastric bypass.  We discussed laparoscopic Roux-en-Y gastric bypass. We discussed the preoperative, operative and postoperative process. Using diagrams, I explained the surgery in detail including the performance of an EGD near the end of the surgery. We discussed the typical hospital course including a 2-3 day stay baring any complications. The patient  was given Neurosurgeon. I quoted the patient that they can expect to lose 50-70% of their excess weight with the gastric bypass. We did discuss the possibility of weight regain several years after the procedure.  We discussed the risk and benefits of surgery including but not limited to anesthesia risk, bleeding, infection, anastomotic edema requiring a few  additional days in the hospital, postop nausea, possible conversion to open procedure, blood clot formation, anastomotic leak, anastomotic stricture, ulcer formation, death, respiratory complications, intestinal blockage, internal hernia, gallstone formation, vitamin and nutritional deficiencies, hair loss, weight regain injury to surrounding structures, failure to lose weight and mood changes.  we did discuss that since she is a revisional case that she is at higher risk for injury to surrounding structures, leak, bleeding. We discussed that revisional surgery patient sometimes have a longer length of stay and readmission rate. We discussed that sometimes reflux and heartburn issues can persist although this is unlikely. Total discussion length 30 minutes Impression: She has lost about 31 pounds since surgery. Admittedly this weight loss is slower than the average patient. We discussed tracking her calories using an app like my fitness pal Current Plans Restarted Phentermine HCl 37.5 MG Oral Capsule, 1 (one) Capsule every morning, #30, 07/15/2018, Ref. x1. GERD WITHOUT ESOPHAGITIS (K21.9) Impression: She is having a significant amount heartburn issues. It sounds like she is using correct eating techniques and behaviors. cont current regimen of meds rx prescribed by GI. I don't think Carafate will be of much benefit. At this point she has tried behavior modification, and what seems to be maximal medical therapy for reflux. She has undergone upper GI and upper endoscopy. At this point I believe the best option for ameliorating her heartburn and reflux is conversion to a gastric bypass. 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 see above discussion regarding heartburn. Discussed the importance of multivitamin and calcium. reDiscussed proper eating techniques and behaviors. Discussed the importance of follow-up with the dietitia we  also discussed HITT training. Advised on what to call for. 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 Redmond Pulling, MD, FACS General, Bariatric, & Minimally Invasive Surgery [facility], [place]

## 2018-10-04 NOTE — Anesthesia Preprocedure Evaluation (Addendum)
Anesthesia Evaluation  Patient identified by MRN, date of birth, ID band Patient awake    Reviewed: Allergy & Precautions, NPO status , Patient's Chart, lab work & pertinent test results  History of Anesthesia Complications (+) PONV, POST - OP SPINAL HEADACHE and history of anesthetic complications  Airway Mallampati: III  TM Distance: >3 FB Neck ROM: Full  Mouth opening: Limited Mouth Opening  Dental  (+) Partial Upper   Pulmonary COPD, former smoker,    breath sounds clear to auscultation       Cardiovascular hypertension, Pt. on home beta blockers and Pt. on medications negative cardio ROS   Rhythm:Regular Rate:Normal     Neuro/Psych PSYCHIATRIC DISORDERS Anxiety negative neurological ROS     GI/Hepatic Neg liver ROS, GERD  Medicated and Poorly Controlled,  Endo/Other  negative endocrine ROS  Renal/GU negative Renal ROS  negative genitourinary   Musculoskeletal  (+) Arthritis , Osteoarthritis,  Fibromyalgia -  Abdominal   Peds  Hematology negative hematology ROS (+)   Anesthesia Other Findings   Reproductive/Obstetrics                           Anesthesia Physical Anesthesia Plan  ASA: III  Anesthesia Plan: General   Post-op Pain Management:    Induction: Intravenous, Rapid sequence and Cricoid pressure planned  PONV Risk Score and Plan: 4 or greater and Scopolamine patch - Pre-op, Midazolam, Dexamethasone and Ondansetron  Airway Management Planned: Oral ETT  Additional Equipment:   Intra-op Plan:   Post-operative Plan: Extubation in OR  Informed Consent: I have reviewed the patients History and Physical, chart, labs and discussed the procedure including the risks, benefits and alternatives for the proposed anesthesia with the patient or authorized representative who has indicated his/her understanding and acceptance.     Dental advisory given  Plan Discussed with:  CRNA  Anesthesia Plan Comments:        Anesthesia Quick Evaluation

## 2018-10-04 NOTE — Progress Notes (Addendum)
Patient alert and oriented, pain is controlled. Patient is walking multiple laps in hallway; steady gait. Using IS.  Looking to start  water at 6pm.   All questions answered, will continue to monitor.

## 2018-10-04 NOTE — Anesthesia Postprocedure Evaluation (Signed)
Anesthesia Post Note  Patient: KRISALYN YANKOWSKI  Procedure(s) Performed: LAPAROSCOPIC ROUX-EN-Y GASTRIC BYPASS WITH UPPER ENDOSCOPY AND ERAS PATHWAY (CONVERSION FROM SLEEVE TO RNY) (N/A )     Patient location during evaluation: PACU Anesthesia Type: General Level of consciousness: awake and alert Pain management: pain level controlled Vital Signs Assessment: post-procedure vital signs reviewed and stable Respiratory status: spontaneous breathing, nonlabored ventilation, respiratory function stable and patient connected to nasal cannula oxygen Cardiovascular status: blood pressure returned to baseline and stable Postop Assessment: no apparent nausea or vomiting Anesthetic complications: no    Last Vitals:  Vitals:   10/04/18 1546 10/04/18 1644  BP: 105/74 105/69  Pulse: 62 64  Resp: 16 18  Temp:    SpO2: 100% 97%    Last Pain:  Vitals:   10/04/18 1546  TempSrc:   PainSc: 8                  Koreen Lizaola L Darolyn Double

## 2018-10-04 NOTE — H&P (Signed)
Nicole [name] is an 43 y.o. female.   Chief Complaint: here for surgery HPI: 43yo wf with h/o sleeve gastrectomy here for conversion to gastric bypass bc of refractory gerd. Denies medical changes since seen in clinic last. No vomiting/f/c/cp/sob/doe.   Past Medical History:  Diagnosis Date  . Anemia   . Anxiety   . Arthritis   . Blood transfusion without reported diagnosis   . Cancer ([place])    cervical   . Chronic kidney disease    kideny stones multi   . COPD (chronic obstructive pulmonary disease) (HCC)    hx of   . Fibromyalgia   . GERD (gastroesophageal reflux disease)    severe, wakes her at night   . Headache    hx of   . History of kidney stones   . Hx of migraines   . Hyperlipidemia   . Hypertension   . Insomnia   . Neuromuscular disorder (HCC)    neuropathy  . Obesity   . PONV (postoperative nausea and vomiting)   . Pre-diabetes   . Spinal headache    post epidural     Past Surgical History:  Procedure Laterality Date  . bilateral breast implant surgery     . BLADDER INSTILLATION    . Bladder stimulator      turned off per patient   . DILATION AND CURETTAGE OF UTERUS    . kidney stones    . LAPAROSCOPIC GASTRIC SLEEVE RESECTION N/A 07/13/2017   Procedure: LAPAROSCOPIC GASTRIC SLEEVE RESECTION WITH UPPER ENDOSCOPY;  Surgeon: [name], MD;  Location: [facility];  Service: General;  Laterality: N/A;  . laser neck surgery     . left knee urgery     . TUBAL LIGATION      Family History  Problem Relation Age of Onset  . Colon cancer Father   . Colon cancer Paternal Grandfather   . Esophageal cancer Neg Hx   . Rectal cancer Neg Hx   . Stomach cancer Neg Hx    Social History:  reports that she has quit smoking. She has never used smokeless tobacco. She reports that she does not drink alcohol or use drugs.  Allergies:  Allergies  Allergen Reactions  . Codeine Itching, Nausea And Vomiting and Nausea Only    Medications Prior to Admission  Medication  Sig Dispense Refill  . acetaminophen (TYLENOL) 325 MG tablet Take by mouth.    . calcium elemental as carbonate (TUMS ULTRA 1000) 400 MG chewable tablet Chew 1,000 mg by mouth as needed for heartburn (Pt states she takes up tp 8 times daily as needed).    . carisoprodol (SOMA) 350 MG tablet Take 350 mg by mouth 2 (two) times daily as needed for muscle spasms.    . cephALEXin (KEFLEX) 500 MG capsule Take 500 mg by mouth 2 (two) times daily as needed (UTI Prevention).    . Lansoprazole (PREVACID PO) Take by mouth.    Marland Kitchen LORazepam (ATIVAN) 1 MG tablet Take 1 mg by mouth every 8 (eight) hours as needed for anxiety.    . Magnesium 500 MG TABS Take 1,000 mg by mouth at bedtime.     . Melatonin 10 MG TBDP Take 10-20 mg by mouth at bedtime.     . meperidine (DEMEROL) 50 MG tablet Take 50 mg by mouth every 6 (six) hours as needed for severe pain.    Marland Kitchen omeprazole (PRILOSEC) 40 MG capsule Take 40 mg by mouth daily.    Marland Kitchen  pantoprazole (PROTONIX) 40 MG tablet Take 1 tablet (40 mg total) by mouth 2 (two) times daily. 180 tablet 3  . phentermine (ADIPEX-P) 37.5 MG tablet Take 37.5 mg by mouth every other day. Patient states had not had it for 2 weeks    . Prenatal Vit-Fe Fumarate-FA (PRENATAL PO) Take 1 tablet by mouth 2 (two) times daily.    . propranolol (INDERAL) 80 MG tablet Take 80 mg by mouth 2 (two) times daily. Patient states has only been taking medication once daily at bedtime    . raNITIdine HCl (ZANTAC PO) Take by mouth.    . traZODone (DESYREL) 50 MG tablet Take 50 mg by mouth at bedtime.    . vitamin B-12 (CYANOCOBALAMIN) 500 MCG tablet Take 500 mcg by mouth daily.    . diphenhydrAMINE (BENADRYL) 50 MG capsule Take 50-100 mg by mouth at bedtime.     . metoCLOPramide (REGLAN) 10 MG tablet Take 10 mg by mouth every 6 (six) hours as needed (for migraine).    . ondansetron (ZOFRAN) 4 MG tablet Take 4 mg by mouth every 8 (eight) hours as needed for nausea or vomiting.    . prochlorperazine (COMPAZINE)  10 MG tablet Take 10 mg by mouth every 6 (six) hours as needed (for migraine).    . zolpidem (AMBIEN) 10 MG tablet Take 10 mg by mouth at bedtime as needed for sleep.      Results for orders placed or performed during the hospital encounter of 10/04/18 (from the past 48 hour(s))  Pregnancy, urine STAT morning of surgery     Status: None   Collection Time: 10/04/18  8:32 AM  Result Value Ref Range   Preg Test, Ur NEGATIVE NEGATIVE    Comment:        THE SENSITIVITY OF THIS METHODOLOGY IS >20 mIU/mL. Performed at [facility], [place] [address], [place], [place] [address]    No results found.  Review of Systems  Gastrointestinal: Positive for heartburn.  All other systems reviewed and are negative.   Blood pressure (!) 102/56, pulse 85, temperature 98 F (36.7 C), temperature source Oral, resp. rate 16, height 5' (1.524 m), weight 79.4 kg, SpO2 97 %. Physical Exam  Vitals reviewed. Constitutional: She is oriented to person, place, and time. She appears well-developed and well-nourished. No distress.  HENT:  Head: Normocephalic and atraumatic.  Right Ear: External ear normal.  Left Ear: External ear normal.  Eyes: Conjunctivae are normal. No scleral icterus.  Neck: Normal range of motion. Neck supple. No tracheal deviation present. No thyromegaly present.  Cardiovascular: Normal rate and normal heart sounds.  Respiratory: Effort normal and breath sounds normal. No stridor. No respiratory distress. She has no wheezes.  GI: Soft. She exhibits no distension. There is no abdominal tenderness. There is no rebound and no guarding.  Musculoskeletal:        General: No tenderness or edema.  Lymphadenopathy:    She has no cervical adenopathy.  Neurological: She is alert and oriented to person, place, and time. She exhibits normal muscle tone.  Skin: Skin is warm and dry. No rash noted. She is not diaphoretic. No erythema. No pallor.  Psychiatric: She has a normal mood  and affect. Her behavior is normal. Judgment and thought content normal.     Assessment/Plan Refractory gerd H/o sleeve gastrectomy Obesity  ERAS protocol Conversion to gastric bypass All questions asked and answered  Leighton Ruff. Redmond Pulling, MD, FACS General, Bariatric, & Minimally Invasive Surgery [facility]   [name], MD 10/04/2018, 10:34 AM

## 2018-10-05 ENCOUNTER — Encounter (HOSPITAL_COMMUNITY): Payer: Self-pay | Admitting: General Surgery

## 2018-10-05 ENCOUNTER — Ambulatory Visit: Payer: Self-pay

## 2018-10-05 LAB — COMPREHENSIVE METABOLIC PANEL WITH GFR
ALT: 26 U/L (ref 0–44)
AST: 24 U/L (ref 15–41)
Albumin: 3.1 g/dL — ABNORMAL LOW (ref 3.5–5.0)
Alkaline Phosphatase: 45 U/L (ref 38–126)
Anion gap: 8 (ref 5–15)
BUN: 9 mg/dL (ref 6–20)
CO2: 22 mmol/L (ref 22–32)
Calcium: 8 mg/dL — ABNORMAL LOW (ref 8.9–10.3)
Chloride: 106 mmol/L (ref 98–111)
Creatinine, Ser: 0.68 mg/dL (ref 0.44–1.00)
GFR calc Af Amer: >60 mL/min
GFR calc non Af Amer: >60 mL/min
Glucose, Bld: 155 mg/dL — ABNORMAL HIGH (ref 70–99)
Potassium: 4 mmol/L (ref 3.5–5.1)
Sodium: 136 mmol/L (ref 135–145)
Total Bilirubin: 0.5 mg/dL (ref 0.3–1.2)
Total Protein: 5.7 g/dL — ABNORMAL LOW (ref 6.5–8.1)

## 2018-10-05 LAB — CBC WITH DIFFERENTIAL/PLATELET
Abs Immature Granulocytes: 0.03 10*3/uL (ref 0.00–0.07)
Basophils Absolute: 0 10*3/uL (ref 0.0–0.1)
Basophils Relative: 0 %
Eosinophils Absolute: 0 10*3/uL (ref 0.0–0.5)
Eosinophils Relative: 0 %
HCT: 35.4 % — ABNORMAL LOW (ref 36.0–46.0)
Hemoglobin: 10.9 g/dL — ABNORMAL LOW (ref 12.0–15.0)
Immature Granulocytes: 0 %
Lymphocytes Relative: 12 %
Lymphs Abs: 1.1 10*3/uL (ref 0.7–4.0)
MCH: 26.4 pg (ref 26.0–34.0)
MCHC: 30.8 g/dL (ref 30.0–36.0)
MCV: 85.7 fL (ref 80.0–100.0)
Monocytes Absolute: 0.6 10*3/uL (ref 0.1–1.0)
Monocytes Relative: 6 %
Neutro Abs: 7.9 10*3/uL — ABNORMAL HIGH (ref 1.7–7.7)
Neutrophils Relative %: 82 %
Platelets: 308 10*3/uL (ref 150–400)
RBC: 4.13 MIL/uL (ref 3.87–5.11)
RDW: 14.1 % (ref 11.5–15.5)
WBC: 9.7 10*3/uL (ref 4.0–10.5)
nRBC: 0 % (ref 0.0–0.2)

## 2018-10-05 MED ORDER — CARISOPRODOL 350 MG PO TABS
350.0000 mg | ORAL_TABLET | Freq: Two times a day (BID) | ORAL | Status: DC | PRN
  Administered 2018-10-05 (×2): 350 mg via ORAL
  Filled 2018-10-05 (×2): qty 1

## 2018-10-05 MED ORDER — PROPRANOLOL HCL 80 MG PO TABS
80.0000 mg | ORAL_TABLET | Freq: Every day | ORAL | Status: DC
  Administered 2018-10-05: 80 mg via ORAL
  Filled 2018-10-05: qty 4
  Filled 2018-10-05: qty 1

## 2018-10-05 NOTE — Progress Notes (Signed)
Patient alert and oriented, Post op day 1.  Provided support and encouragement.  Encouraged pulmonary toilet, ambulation and small sips of liquids.  Completed 12 ounces of bari clear fluid, trying our chicken soup Unjury.  All questions answered.  Will continue to monitor.

## 2018-10-05 NOTE — Plan of Care (Signed)

## 2018-10-05 NOTE — Progress Notes (Signed)
PHARMACY CONSULT FOR:  Risk Assessment for Post-Discharge VTE Following Bariatric Surgery  Post-Discharge VTE Risk Assessment: This patient's probability of 30-day post-discharge VTE is increased due to the factors marked:   Female    Age >/=60 years    BMI >/=50 kg/m2    CHF    Dyspnea at Rest    Paraplegia    Non-gastric-band surgery    Operation Time >/=3 hr    Return to OR     Length of Stay >/= 3 d   Predicted probability of 30-day post-discharge VTE: 0.16%  Other patient-specific factors to consider:  Recommendation for Discharge: No pharmacologic prophylaxis post-discharge  Nicole [name] is a 43 y.o. female with hx sleeve gastrectomy who underwent conversion from sleeve laparoscopic Roux-en-Y gastric bypass  10/04/18   Case start: 11:43 Case end: 13:43   Allergies  Allergen Reactions  . Codeine Itching, Nausea And Vomiting and Nausea Only    Patient Measurements: Height: 5' (152.4 cm) Weight: 175 lb 2 oz (79.4 kg) IBW/kg (Calculated) : 45.5 Body mass index is 34.2 kg/m.  Recent Labs    10/04/18 1552 10/05/18 0328  WBC  --  9.7  HGB 13.0 10.9*  HCT 42.1 35.4*  PLT  --  308  CREATININE  --  0.68  ALBUMIN  --  3.1*  PROT  --  5.7*  AST  --  24  ALT  --  26  ALKPHOS  --  45  BILITOT  --  0.5   Estimated Creatinine Clearance: 83.7 mL/min (by C-G formula based on SCr of 0.68 mg/dL).    Past Medical History:  Diagnosis Date  . Anemia   . Anxiety   . Arthritis   . Blood transfusion without reported diagnosis   . Cancer ([place])    cervical   . Chronic kidney disease    kideny stones multi   . COPD (chronic obstructive pulmonary disease) (HCC)    hx of   . Fibromyalgia   . GERD (gastroesophageal reflux disease)    severe, wakes her at night   . Headache    hx of   . History of kidney stones   . Hx of migraines   . Hyperlipidemia   . Hypertension   . Insomnia   . Neuromuscular disorder (HCC)    neuropathy  . Obesity   . PONV (postoperative  nausea and vomiting)   . Pre-diabetes   . Spinal headache    post epidural      Medications Prior to Admission  Medication Sig Dispense Refill Last Dose  . acetaminophen (TYLENOL) 325 MG tablet Take by mouth.   10/03/2018 at Unknown time  . calcium elemental as carbonate (TUMS ULTRA 1000) 400 MG chewable tablet Chew 1,000 mg by mouth as needed for heartburn (Pt states she takes up tp 8 times daily as needed).   10/03/2018 at Unknown time  . carisoprodol (SOMA) 350 MG tablet Take 350 mg by mouth 2 (two) times daily as needed for muscle spasms.   10/03/2018 at Unknown time  . cephALEXin (KEFLEX) 500 MG capsule Take 500 mg by mouth 2 (two) times daily as needed (UTI Prevention).   10/03/2018 at Unknown time  . Lansoprazole (PREVACID PO) Take by mouth.   09/20/2018  . LORazepam (ATIVAN) 1 MG tablet Take 1 mg by mouth every 8 (eight) hours as needed for anxiety.   10/04/2018 at [place]  . Magnesium 500 MG TABS Take 1,000 mg by mouth at bedtime.  10/03/2018 at Unknown time  . Melatonin 10 MG TBDP Take 10-20 mg by mouth at bedtime.    10/03/2018 at Unknown time  . meperidine (DEMEROL) 50 MG tablet Take 50 mg by mouth every 6 (six) hours as needed for severe pain.   Past Month at Unknown time  . omeprazole (PRILOSEC) 40 MG capsule Take 40 mg by mouth daily.   10/03/2018 at Unknown time  . pantoprazole (PROTONIX) 40 MG tablet Take 1 tablet (40 mg total) by mouth 2 (two) times daily. 180 tablet 3 10/04/2018 at 0645  . phentermine (ADIPEX-P) 37.5 MG tablet Take 37.5 mg by mouth every other day. Patient states had not had it for 2 weeks   Past Month at Unknown time  . Prenatal Vit-Fe Fumarate-FA (PRENATAL PO) Take 1 tablet by mouth 2 (two) times daily.   10/03/2018 at Unknown time  . propranolol (INDERAL) 80 MG tablet Take 80 mg by mouth 2 (two) times daily. Patient states has only been taking medication once daily at bedtime   10/04/2018 at 0645  . raNITIdine HCl (ZANTAC PO) Take by mouth.   09/20/2018  . traZODone  (DESYREL) 50 MG tablet Take 50 mg by mouth at bedtime.   10/03/2018 at Unknown time  . vitamin B-12 (CYANOCOBALAMIN) 500 MCG tablet Take 500 mcg by mouth daily.   10/03/2018 at Unknown time  . diphenhydrAMINE (BENADRYL) 50 MG capsule Take 50-100 mg by mouth at bedtime.    More than a month at Unknown time  . metoCLOPramide (REGLAN) 10 MG tablet Take 10 mg by mouth every 6 (six) hours as needed (for migraine).   More than a month at Unknown time  . ondansetron (ZOFRAN) 4 MG tablet Take 4 mg by mouth every 8 (eight) hours as needed for nausea or vomiting.   More than a month at Unknown time  . prochlorperazine (COMPAZINE) 10 MG tablet Take 10 mg by mouth every 6 (six) hours as needed (for migraine).   More than a month at Unknown time  . zolpidem (AMBIEN) 10 MG tablet Take 10 mg by mouth at bedtime as needed for sleep.   More than a month at Unknown time    Ulice Dash, PharmD Clinical Pharmacist Pager # [phone]  10/05/2018,1:21 PM

## 2018-10-05 NOTE — Progress Notes (Signed)
1 Day Post-Op   Subjective/Chief Complaint: Doing well. Had some nausea last night that was resolved with protonix Minimal pain Did well with water; already on shakes   Objective: Vital signs in last 24 hours: Temp:  [97.4 F (36.3 C)-98.3 F (36.8 C)] 97.9 F (36.6 C) (01/21 0517) Pulse Rate:  [58-85] 71 (01/21 0517) Resp:  [10-18] 18 (01/21 0517) BP: (96-120)/(56-100) 96/60 (01/21 0517) SpO2:  [95 %-100 %] 96 % (01/21 0517) Weight:  [79.4 kg] 79.4 kg (01/20 0837) Last BM Date: 10/03/18  Intake/Output from previous day: 01/20 0701 - 01/21 0700 In: 3626.8 [P.O.:660; I.V.:2966.8] Out: 2010 [Urine:1985; Blood:25] Intake/Output this shift: No intake/output data recorded.  Alert, nad Walking in halls cta  Reg Soft, min TTP, incisions ok No edema  Lab Results:  Recent Labs    10/04/18 1552 10/05/18 0328  WBC  --  9.7  HGB 13.0 10.9*  HCT 42.1 35.4*  PLT  --  308   BMET Recent Labs    10/05/18 0328  NA 136  K 4.0  CL 106  CO2 22  GLUCOSE 155*  BUN 9  CREATININE 0.68  CALCIUM 8.0*   PT/INR No results for input(s): LABPROT, INR in the last 72 hours. ABG No results for input(s): PHART, HCO3 in the last 72 hours.  Invalid input(s): PCO2, PO2  Studies/Results: No results found.  Anti-infectives: Anti-infectives (From admission, onward)   Start     Dose/Rate Route Frequency Ordered Stop   10/04/18 0845  cefoTEtan (CEFOTAN) 2 g in sodium chloride 0.9 % 100 mL IVPB     2 g 200 mL/hr over 30 Minutes Intravenous On call to O.R. 10/04/18 0831 10/04/18 1148   10/04/18 0838  sodium chloride 0.9 % with cefoTEtan (CEFOTAN) ADS Med    Note to Pharmacy:  Waldron Session   : cabinet override      10/04/18 0838 10/04/18 1118      Assessment/Plan: s/p Procedure(s): LAPAROSCOPIC ROUX-EN-Y GASTRIC BYPASS WITH UPPER ENDOSCOPY AND ERAS PATHWAY (CONVERSION FROM SLEEVE TO RNY) (N/A)   Doing well No fever. No tachycardia Cont post op diet as tolerated Cont  chemical vte prophylaxis Restart home meds Keep today for continued observation since she was a conversion  LOS: 1 day    [name] 10/05/2018

## 2018-10-05 NOTE — Discharge Instructions (Signed)
° ° ° °GASTRIC BYPASS/SLEEVE ° Home Care Instructions ° ° These instructions are to help you care for yourself when you go home. ° °Call: If you have any problems. °• Call [phone] and ask for the surgeon on call °• If you need immediate help, come to the ER at [facility].  °• Tell the ER staff that you are a new post-op gastric bypass or gastric sleeve patient °  °Signs and symptoms to report: • Severe vomiting or nausea °o If you cannot keep down clear liquids for longer than 1 day, call your surgeon  °• Abdominal pain that does not get better after taking your pain medication °• Fever over 100.4° F with chills °• Heart beating over 100 beats a minute °• Shortness of breath at rest °• Chest pain °•  Redness, swelling, drainage, or foul odor at incision (surgical) sites °•  If your incisions open or pull apart °• Swelling or pain in calf (lower leg) °• Diarrhea (Loose bowel movements that happen often), frequent watery, uncontrolled bowel movements °• Constipation, (no bowel movements for 3 days) if this happens: Pick one °o Milk of Magnesia, 2 tablespoons by mouth, 3 times a day for 2 days if needed °o Stop taking Milk of Magnesia once you have a bowel movement °o Call your doctor if constipation continues °Or °o Miralax  (instead of Milk of Magnesia) following the label instructions °o Stop taking Miralax once you have a bowel movement °o Call your doctor if constipation continues °• Anything you think is not normal °  °Normal side effects after surgery: • Unable to sleep at night or unable to focus °• Irritability or moody °• Being tearful (crying) or depressed °These are common complaints, possibly related to your anesthesia medications that put you to sleep, stress of surgery, and change in lifestyle.  This usually goes away a few weeks after surgery.  If these feelings continue, call your primary care doctor. °  °Wound Care: You may have surgical glue, steri-strips, or staples over your incisions after  surgery °• Surgical glue:  Looks like a clear film over your incisions and will wear off a little at a time °• Steri-strips: Strips of tape over your incisions. You may notice a yellowish color on the skin under the steri-strips. This is used to make the   steri-strips stick better. Do not pull the steri-strips off - let them fall off °• Staples: Staples may be removed before you leave the hospital °o If you go home with staples, call [facility], [phone] at for an appointment with your surgeon’s nurse to have staples removed 10 days after surgery. °• Showering: You may shower two (2) days after your surgery unless your surgeon tells you differently °o Wash gently around incisions with warm soapy water, rinse well, and gently pat dry  °o No tub baths until staples are removed, steri-strips fall off or glue is gone.  °  °Medications: • Medications should be liquid or crushed if larger than the size of a dime °• Extended release pills (medication that release a little bit at a time through the day) should NOT be crushed or cut. (examples include XL, ER, DR, SR) °• Depending on the size and number of medications you take, you may need to space (take a few throughout the day)/change the time you take your medications so that you do not over-fill your pouch (smaller stomach) °• Make sure you follow-up with your primary care doctor to   make medication changes needed during rapid weight loss and life-style changes °• If you have diabetes, follow up with the doctor that orders your diabetes medication(s) within one week after surgery and check your blood sugar regularly. °• Do not drive while taking prescription pain medication  °• It is ok to take Tylenol by the bottle instructions with your pain medicine or instead of your pain medicine as needed.  DO NOT TAKE NSAIDS (EXAMPLES OF NSAIDS:  IBUPROFREN/ NAPROXEN)  °Diet:                    First 2 Weeks ° You will see the dietician t about two (2) weeks  after your surgery. The dietician will increase the types of foods you can eat if you are handling liquids well: °• If you have severe vomiting or nausea and cannot keep down clear liquids lasting longer than 1 day, call your surgeon @ ([phone]) °Protein Shake °• Drink at least 2 ounces of shake 5-6 times per day °• Each serving of protein shakes (usually 8 - 12 ounces) should have: °o 15 grams of protein  °o And no more than 5 grams of carbohydrate  °• Goal for protein each day: °o Men = 80 grams per day °o Women = 60 grams per day °• Protein powder may be added to fluids such as non-fat milk or Lactaid milk or unsweetened Soy/Almond milk (limit to 35 grams added protein powder per serving) ° °Hydration °• Slowly increase the amount of water and other clear liquids as tolerated (See Acceptable Fluids) °• Slowly increase the amount of protein shake as tolerated  °•  Sip fluids slowly and throughout the day.  Do not use straws. °• May use sugar substitutes in small amounts (no more than 6 - 8 packets per day; i.e. Splenda) ° °Fluid Goal °• The first goal is to drink at least 8 ounces of protein shake/drink per day (or as directed by the nutritionist); some examples of protein shakes are Syntrax Nectar, Adkins Advantage, EAS Edge HP, and Unjury. See handout from pre-op Bariatric Education Class: °o Slowly increase the amount of protein shake you drink as tolerated °o You may find it easier to slowly sip shakes throughout the day °o It is important to get your proteins in first °• Your fluid goal is to drink 64 - 100 ounces of fluid daily °o It may take a few weeks to build up to this °• 32 oz (or more) should be clear liquids  °And  °• 32 oz (or more) should be full liquids (see below for examples) °• Liquids should not contain sugar, caffeine, or carbonation ° °Clear Liquids: °• Water or Sugar-free flavored water (i.e. Fruit H2O, Propel) °• Decaffeinated coffee or tea (sugar-free) °• Crystal Lite, Wyler’s Lite,  Minute Maid Lite °• Sugar-free Jell-O °• Bouillon or broth °• Sugar-free Popsicle:   *Less than 20 calories each; Limit 1 per day ° °Full Liquids: °Protein Shakes/Drinks + 2 choices per day of other full liquids °• Full liquids must be: °o No More Than 15 grams of Carbs per serving  °o No More Than 3 grams of Fat per serving °• Strained low-fat cream soup (except Cream of Potato or Tomato) °• Non-Fat milk °• Fat-free Lactaid Milk °• Unsweetened Soy Or Unsweetened Almond Milk °• Low Sugar yogurt (Dannon Lite & Fit, Greek yogurt; Oikos Triple Zero; Chobani Simply 100; Yoplait 100 calorie Greek - No Fruit on the Bottom) ° °  °Vitamins   and Minerals • Start 1 day after surgery unless otherwise directed by your surgeon °• 2 Chewable Bariatric Specific Multivitamin / Multimineral Supplement with iron (Example: Bariatric Advantage Multi EA) °• Chewable Calcium with Vitamin D-3 °(Example: 3 Chewable Calcium Plus 600 with Vitamin D-3) °o Take 500 mg three (3) times a day for a total of 1500 mg each day °o Do not take all 3 doses of calcium at one time as it may cause constipation, and you can only absorb 500 mg  at a time  °o Do not mix multivitamins containing iron with calcium supplements; take 2 hours apart °• Menstruating women and those with a history of anemia (a blood disease that causes weakness) may need extra iron °o Talk with your doctor to see if you need more iron °• Do not stop taking or change any vitamins or minerals until you talk to your dietitian or surgeon °• Your Dietitian and/or surgeon must approve all vitamin and mineral supplements °  °Activity and Exercise: Limit your physical activity as instructed by your doctor.  It is important to continue walking at home.  During this time, use these guidelines: °• Do not lift anything greater than ten (10) pounds for at least two (2) weeks °• Do not go back to work or drive until your surgeon says you can °• You may have sex when you feel comfortable  °o It is  VERY important for female patients to use a reliable birth control method; fertility often increases after surgery  °o All hormonal birth control will be ineffective for 30 days after surgery due to medications given during surgery a barrier method must be used. °o Do not get pregnant for at least 18 months °• Start exercising as soon as your doctor tells you that you can °o Make sure your doctor approves any physical activity °• Start with a simple walking program °• Walk 5-15 minutes each day, 7 days per week.  °• Slowly increase until you are walking 30-45 minutes per day °Consider joining our BELT program. [phone] or email [email or web address] °  °Special Instructions Things to remember: °• Use your CPAP when sleeping if this applies to you ° °• [facility] has two free Bariatric Surgery Support Groups that meet monthly °o The 3rd Thursday of each month, 6 pm, [facility]  °o The 2nd Friday of each month, 11:45 am in the private dining room in the basement of [place] °• It is very important to keep all follow up appointments with your surgeon, dietitian, primary care physician, and behavioral health practitioner °• Routine follow up schedule with your surgeon include appointments at 2-3 weeks, 6-8 weeks, 6 months, and 1 year at a minimum.  Your surgeon may request to see you more often.   °o After the first year, please follow up with your bariatric surgeon and dietitian at least once a year in order to maintain best weight loss results °[facility]: [phone] °[facility]: [phone] °Bariatric Nurse Coordinator: [phone] °  °   Reviewed and Endorsed  °by [facility] Patient Education Committee, June, 2016 °Edits Approved: Aug, 2018 ° ° ° °

## 2018-10-06 ENCOUNTER — Encounter (HOSPITAL_COMMUNITY): Payer: Self-pay | Admitting: General Surgery

## 2018-10-06 LAB — CBC WITH DIFFERENTIAL/PLATELET
Abs Immature Granulocytes: 0.02 10*3/uL (ref 0.00–0.07)
Basophils Absolute: 0 10*3/uL (ref 0.0–0.1)
Basophils Relative: 1 %
Eosinophils Absolute: 0.1 10*3/uL (ref 0.0–0.5)
Eosinophils Relative: 1 %
HCT: 32.8 % — ABNORMAL LOW (ref 36.0–46.0)
Hemoglobin: 9.8 g/dL — ABNORMAL LOW (ref 12.0–15.0)
Immature Granulocytes: 0 %
Lymphocytes Relative: 28 %
Lymphs Abs: 2.3 10*3/uL (ref 0.7–4.0)
MCH: 26.4 pg (ref 26.0–34.0)
MCHC: 29.9 g/dL — ABNORMAL LOW (ref 30.0–36.0)
MCV: 88.4 fL (ref 80.0–100.0)
Monocytes Absolute: 0.7 10*3/uL (ref 0.1–1.0)
Monocytes Relative: 9 %
Neutro Abs: 5.1 10*3/uL (ref 1.7–7.7)
Neutrophils Relative %: 61 %
Platelets: 242 10*3/uL (ref 150–400)
RBC: 3.71 MIL/uL — AB (ref 3.87–5.11)
RDW: 14.4 % (ref 11.5–15.5)
WBC: 8.2 10*3/uL (ref 4.0–10.5)
nRBC: 0 % (ref 0.0–0.2)

## 2018-10-06 MED ORDER — TRAMADOL HCL 50 MG PO TABS: 50.0000 mg | ORAL_TABLET | Freq: Four times a day (QID) | ORAL | 0 refills | Status: AC | PRN

## 2018-10-06 MED ORDER — ONDANSETRON 4 MG PO TBDP: 4.0000 mg | ORAL_TABLET | Freq: Four times a day (QID) | ORAL | 0 refills | Status: AC | PRN

## 2018-10-06 NOTE — Progress Notes (Signed)
Patient alert and oriented, Post op day 2.  Provided support and encouragement.  Encouraged pulmonary toilet, ambulation and small sips of liquids. Completed 2 protein shakes last 24 hours. Denies nausea.   All questions answered.  Will continue to monitor.

## 2018-10-06 NOTE — Discharge Summary (Signed)
Physician Discharge Summary  Nicole [name] KGM:[id number] DOB: 01-07-1976 DOA: 10/04/2018  PCP: [name], PA-C  Admit date: 10/04/2018 Discharge date: 10/06/2018  Recommendations for Outpatient Follow-up:    Follow-up Information    [name], MD. Go on 10/21/2018.   Specialty:  General Surgery Why:  at 415.  Please arrive 15 minutes early for your appointment.  Thank you Contact information: [address] [place] [address] [phone]        Carlena Hurl, PA-C. Go on 11/16/2018.   Specialty:  General Surgery Why:  at 2 pm.  Please arrive 15 minutes early for your appointment.  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  Please see operative note. Preoperatively the patient was given 5000 units of subcutaneous heparin for DVT prophylaxis. ERAS protocol was used. Postoperative prophylactic Lovenox dosing was started on the evening of postoperative day 0.  The patient was started on ice chips and water on the evening of POD 0 which they tolerated. On postoperative day 1 The patient's diet was advanced to protein shakes which they also tolerated. On POD 2, The patient was ambulating without difficulty. Their vital signs are stable without fever or tachycardia. The patient had received discharge instructions and counseling. They were deemed stable for discharge.  BP 105/68 (BP Location: Right Arm)   Pulse 63   Temp  98.3 F (36.8 C) (Oral)   Resp 18   Ht 5' (1.524 m)   Wt 79.4 kg   LMP  (Approximate) Comment: pt states last july was last period  SpO2 97%   BMI 34.20 kg/m   Gen: alert, NAD, non-toxic appearing Pupils: equal, no scleral icterus Pulm: Lungs clear to auscultation, symmetric chest rise CV: regular rate and rhythm Abd: soft, min tender, nondistended. No cellulitis. No incisional hernia Ext: no edema, no calf tenderness Skin: no rash, no jaundice  Discharge Instructions  Discharge Instructions    Ambulate hourly while awake   Complete by:  As directed    Call MD for:  difficulty breathing, headache or visual disturbances   Complete by:  As directed    Call MD for:  persistant dizziness or light-headedness   Complete by:  As directed    Call MD for:  persistant nausea and vomiting   Complete by:  As directed    Call MD for:  redness, tenderness, or signs of infection (pain, swelling, redness, odor or green/yellow discharge around incision site)   Complete by:  As directed    Call MD for:  severe uncontrolled pain   Complete by:  As directed    Call MD for:  temperature >101 F   Complete by:  As directed    Diet bariatric full liquid   Complete by:  As directed    Discharge instructions   Complete by:  As directed    See bariatric discharge instructions   Incentive spirometry   Complete by:  As directed  Perform hourly while awake     Allergies as of 10/06/2018      Reactions   Codeine Itching, Nausea And Vomiting, Nausea Only      Medication List    STOP taking these medications   meperidine 50 MG tablet Commonly known as:  DEMEROL   ondansetron 4 MG tablet Commonly known as:  ZOFRAN   pantoprazole 40 MG tablet Commonly known as:  PROTONIX   phentermine 37.5 MG tablet Commonly known as:  ADIPEX-P   PREVACID PO   ZANTAC PO     TAKE these medications   acetaminophen 325 MG tablet Commonly known as:  TYLENOL Take by mouth.   carisoprodol 350 MG  tablet Commonly known as:  SOMA Take 350 mg by mouth 2 (two) times daily as needed for muscle spasms.   cephALEXin 500 MG capsule Commonly known as:  KEFLEX Take 500 mg by mouth 2 (two) times daily as needed (UTI Prevention).   diphenhydrAMINE 50 MG capsule Commonly known as:  BENADRYL Take 50-100 mg by mouth at bedtime.   LORazepam 1 MG tablet Commonly known as:  ATIVAN Take 1 mg by mouth every 8 (eight) hours as needed for anxiety.   Magnesium 500 MG Tabs Take 1,000 mg by mouth at bedtime.   Melatonin 10 MG Tbdp Take 10-20 mg by mouth at bedtime.   metoCLOPramide 10 MG tablet Commonly known as:  REGLAN Take 10 mg by mouth every 6 (six) hours as needed (for migraine).   omeprazole 40 MG capsule Commonly known as:  PRILOSEC Take 40 mg by mouth daily.   ondansetron 4 MG disintegrating tablet Commonly known as:  ZOFRAN-ODT Take 1 tablet (4 mg total) by mouth every 6 (six) hours as needed for nausea or vomiting.   PRENATAL PO Take 1 tablet by mouth 2 (two) times daily.   prochlorperazine 10 MG tablet Commonly known as:  COMPAZINE Take 10 mg by mouth every 6 (six) hours as needed (for migraine).   propranolol 80 MG tablet Commonly known as:  INDERAL Take 80 mg by mouth 2 (two) times daily. Patient states has only been taking medication once daily at bedtime   traMADol 50 MG tablet Commonly known as:  ULTRAM Take 1 tablet (50 mg total) by mouth every 6 (six) hours as needed (pain).   traZODone 50 MG tablet Commonly known as:  DESYREL Take 50 mg by mouth at bedtime.   TUMS ULTRA 1000 400 MG chewable tablet Generic drug:  calcium elemental as carbonate Chew 1,000 mg by mouth as needed for heartburn (Pt states she takes up tp 8 times daily as needed).   vitamin B-12 500 MCG tablet Commonly known as:  CYANOCOBALAMIN Take 500 mcg by mouth daily.   zolpidem 10 MG tablet Commonly known as:  AMBIEN Take 10 mg by mouth at bedtime as needed for sleep.       Follow-up Information    [name], MD. Go on 10/21/2018.   Specialty:  General Surgery Why:  at 415.  Please arrive 15 minutes early for your appointment.  Thank you Contact information: [address] [place] [address] [phone]        Carlena Hurl, PA-C. Go on 11/16/2018.   Specialty:  General Surgery Why:  at 2 pm.  Please arrive 15 minutes early for your appointment.  Thank you Contact information: [place] [address] [phone]  The results of significant diagnostics from this hospitalization (including imaging, microbiology, ancillary and laboratory) are listed below for reference.    Significant Diagnostic Studies: No results found.  Labs: Basic Metabolic Panel: Recent Labs  Lab 09/30/18 1001 10/05/18 0328  NA 137 136  K 4.1 4.0  CL 104 106  CO2 24 22  GLUCOSE 91 155*  BUN 9 9  CREATININE 0.72 0.68  CALCIUM 9.1 8.0*   Liver Function Tests: Recent Labs  Lab 09/30/18 1001 10/05/18 0328  AST 22 24  ALT 25 26  ALKPHOS 59 45  BILITOT 0.5 0.5  PROT 7.0 5.7*  ALBUMIN 4.0 3.1*    CBC: Recent Labs  Lab 09/30/18 1001 10/04/18 1552 10/05/18 0328 10/06/18 0424  WBC 8.2  --  9.7 8.2  NEUTROABS 4.0  --  7.9* 5.1  HGB 13.0 13.0 10.9* 9.8*  HCT 41.7 42.1 35.4* 32.8*  MCV 84.8  --  85.7 88.4  PLT 375  --  308 242    CBG: No results for input(s): GLUCAP in the last 168 hours.  Active Problems:   Obesity (BMI 30-39.9) Refractory GERD H/o sleeve gastrectomy S/p Lap conversion of sleeve gastrectomy to roux en y gastric bypass  Time coordinating discharge: 15 min  Signed:  [name], MD [facility], [place] [phone] 10/06/2018, 9:14 AM

## 2018-10-06 NOTE — Progress Notes (Signed)
Patient alert and oriented, pain is controlled. Patient is tolerating fluids, advanced to protein shake today, patient is tolerating well. Reviewed Gastric Bypass discharge instructions with patient and patient is able to articulate understanding. Provided information on BELT program, Support Group and [facility] outpatient pharmacy. All questions answered, will continue to monitor.   Total fluid intake 834 Per dehydration protocol call back one week post op

## 2018-10-06 NOTE — Progress Notes (Signed)
Discharge instructions reviewed by [facility]. Questions answered and patient denied further questions. Spouse is en route to drive her home. No prescriptions given to patient. Donne Hazel, RN

## 2018-10-11 ENCOUNTER — Telehealth (HOSPITAL_COMMUNITY): Payer: Self-pay

## 2018-10-11 NOTE — Telephone Encounter (Signed)
Patient called to discuss post bariatric surgery follow up questions.  See below:   1.  Tell me about your pain and pain management?last time took pain medication was last Friday which was tylenol not pain med  2.  Let's talk about fluid intake.  How much total fluid are you taking in?64 + ounces  3.  How much protein have you taken in the last 2 days?64 grams  4.  Have you had nausea?  Tell me about when have experienced nausea and what you did to help?denies  5.  Has the frequency or color changed with your urine?urine clear color and much more now than before  6.  Tell me what your incisions look like?no issues  7.  Have you been passing gas? BM?passing gas having bms.  Was having loose bms called office Friday and instructed to take immodium per package.  Patient decreased magnesium which has helped with bm but not loose  8.  If a problem or question were to arise who would you call?  Do you know contact numbers for [facility], [facility], and [facility]?aware of how to contact services  9.  How has the walking going?walking a lot has 45 year old, been out to mall and church  10.  How are your vitamins and calcium going?  How are you taking them?taking both we did discuss calcium citrate as patient has history of kidney stones.   No additional issues with acid reflux at this time.  States she feels no restriction as she did before.  We discussed anatomy and how bypass differs from sleeve.  States understanding

## 2018-10-19 ENCOUNTER — Ambulatory Visit: Payer: Self-pay

## 2018-10-21 ENCOUNTER — Ambulatory Visit: Payer: Self-pay | Admitting: Skilled Nursing Facility1

## 2018-10-25 ENCOUNTER — Encounter: Payer: Medicare HMO | Attending: General Surgery | Admitting: Skilled Nursing Facility1

## 2018-10-25 NOTE — Progress Notes (Signed)
Follow-up visit: Post-Operative Sleeve to RYGB Gastrectomy Surgery  Primary concerns today: Post-operative Bariatric Surgery Nutrition Management  Pt states she is taking the protoonix every other day instead of multiple times a day. Pt got gastric bypass January 20th. Pt states she did eat a waffle fry from [employer] that made her sick. Pt states as long as she takes her magnesium she will have a bowel movement. Pt staets she is upset she does not have the restriction she feels she should have: dietitian educated the pt on the fact she has restriction its just not pain like she was experiencing with the sleeve which is GOOD not BAD.   Pt states she walks 1.5 miles 4-5 times a week.   Supplement: opurity capsule and calcium   Surgery date: 10/04/2018 Surgery type: sleeve Start weight at [facility]: 217.4 Weight today: [place] RESULTS  08/04/2017 12/18/2017 10/25/2018   BMI (kg/m^2) 39.7 36.8 31.8   Fat Mass (lbs) 98.4 84.8 68.2   Fat Free Mass (lbs) 104.8 103.6 94.8   Total Body Water (lbs) 75.8 74.2 66.8    The following the learning objectives were met by the patient during this course:  Identifies Phase 3A (Soft, High Proteins) Dietary Goals and will begin from 2 weeks post-operatively to 2 months post-operatively  Identifies appropriate sources of fluids and proteins   States protein recommendations and appropriate sources post-operatively  Identifies the need for appropriate texture modifications, mastication, and bite sizes when consuming solids  Identifies appropriate multivitamin and calcium sources post-operatively  Describes the need for physical activity post-operatively and will follow MD recommendations  States when to call healthcare provider regarding medication questions or post-operative complications  Handouts given during class include:  Phase 3A: Soft, High Protein Diet Handout  Follow-Up Plan: Patient will follow-up at [facility] in 6 weeks for 2  month post-op nutrition visit for diet advancement per MD.

## 2018-11-11 ENCOUNTER — Encounter: Payer: Medicare HMO | Admitting: Skilled Nursing Facility1

## 2018-11-11 NOTE — Patient Instructions (Signed)
-  Take your multi later in the day and take the calcium in the morning   -Continue to aim for a minimum of 64 fluid ounces 7 days a week with at least 30 ounces being plain water  -Eat non-starchy vegetables 2 times a day 7 days a week  -Start out with soft cooked vegetables today and tomorrow; if tolerated begin to eat raw vegetables or cooked including salads  -Eat your 3 ounces of protein first then start in on your non-starchy vegetables; once you understand how much of your meal leads to satisfaction and not full while still eating 3 ounces of protein and non-starchy vegetables you can eat them in any order   -Continue to aim for 30 minutes of activity at least 5 times a week  -Do NOT cook with/add to your food: alfredo sauce, cheese sauce, barbeque sauce, ketchup, fat back, butter, bacon grease, grease, Crisco

## 2018-11-11 NOTE — Progress Notes (Signed)
Follow-up visit:  6 Weeks Post-Operative  Surgery  Pt states she can tell the difference with muscle mass and how her clothes are fitting. Pt states she does better with chicken than seafood which feels it gets stuck. Pt states she is disappointed with gaining weight (dietitian corrected her in that she has not gained weight from her last nutrition appt). Pt states beef makes her gag. Pt states she vomited with beef. Pt states if she eats something solid in the morning she will have nausea until about 2-3pm. Nausea if anything solid before 2pm. Pt states she walks around inbetween bites of food.  Pt states she is going on a disney cruise and is excited.   Supplement: opurity capsule and calcium   Surgery date: 10/04/2018 Surgery type: RYGB [facility] weight at [facility]: 217.4 Weight today: 163.3  Body Composition Scale  Total Body Fat: 36.2 %  Visceral Fat: 10  Fat-Free Mass: 63.7  %   Total Body Water: 46.3  %  Muscle-Mass: 28  lbs  Body Fat Displacement:  Torso: 36.4  lbs Left Leg: 7.2 lbs Right Leg: 7.2  lbs Left Arm: 3.6 lbs Right Arm: 6.3  lbs  24-hr recall: B (AM): decaf coffee with protein powder (31g) Snk (AM): 18 ounces water L (PM):  Decaf coffee with protein powder  Snk (2-3PM): greek yogurt (15g protein) with half scoop protein powder 18 ounces water  Or half boiled egg or bacon D (7:30-8PM): eggs or chicken Snk (PM): sometimes other half hard boiled egg or bacon   Fluid intake: 70-90 ounces  Estimated total protein intake: 80+  Medications: see list Supplementation: procare capsule and calcium   Using straws: no Drinking while eating: no Having you been chewing well:no Chewing/swallowing difficulties: no Changes in vision: no Changes to mood/headaches: no Hair loss/Cahnges to skin/Changes to nails: no Any difficulty focusing or concentrating: no Sweating: no Dizziness/Lightheaded: no Palpitations: no  Carbonated beverages: no N/V/D/C/GAS: no Abdominal  Pain: no Dumping syndrome: no  Recent physical activity:  Squats while watching tv, step up on chairs   Progress Towards Goal(s):  In progress.  Handouts given during visit include:  Non starchy veggies    Intervention:  Nutrition counseling. Pts diet was advanced to the next phase now including non starchy veggies. Due to the bodies need for essential vitamins, minerals, and fats the pt was educated on the need to consume a certain amount of calories as well as certain nutrients daily. Pt was educated on the need for daily physical activity and to reach a goal of at least 150 minutes of moderate to vigorous physical activity as directed by their physician due to such benefits as increased musculature and improved lab values.  Goals: -Take your multi later in the day and take the calcium in the morning  -Continue to aim for a minimum of 64 fluid ounces 7 days a week with at least 30 ounces being plain water -Eat non-starchy vegetables 2 times a day 7 days a week -Start out with soft cooked vegetables today and tomorrow; if tolerated begin to eat raw vegetables or cooked including salads -Eat your 3 ounces of protein first then start in on your non-starchy vegetables; once you understand how much of your meal leads to satisfaction and not full while still eating 3 ounces of protein and non-starchy vegetables you can eat them in any order  -Continue to aim for 30 minutes of activity at least 5 times a week -Do NOT cook with/add to  your food: alfredo sauce, cheese sauce, barbeque sauce, ketchup, fat back, butter, bacon grease, grease, Crisco  Teaching Method Utilized:  Visual Auditory Hands on  Barriers to learning/adherence to lifestyle change:   Demonstrated degree of understanding via:  Teach Back   Monitoring/Evaluation:  Dietary intake, exercise, and body weight. Follow up in 3 weeks

## 2018-12-02 ENCOUNTER — Ambulatory Visit: Payer: Self-pay | Admitting: Skilled Nursing Facility1

## 2019-01-29 IMAGING — RF DG UGI W/ KUB
9 of 14 series · 12 of 24 positions shown · non-contrast
Comparison: None

CLINICAL DATA: 41-year-old female preoperative study for bariatric
surgery. Personal history of bladder stimulator.

EXAM:
UPPER GI SERIES WITH KUB
TECHNIQUE: After obtaining a scout radiograph a routine upper GI series was
performed using barium
FLUOROSCOPY TIME:  Fluoroscopy Time:  1 minutes 18 seconds
Radiation Exposure Index (if provided by the fluoroscopic device):
44.1 mGy
Number of Acquired Spot Images: 0

[Series 2: cp_standard · 0.53mm/px · 2 of 56 frames shown (1 of 9)]
[frame 2/56]
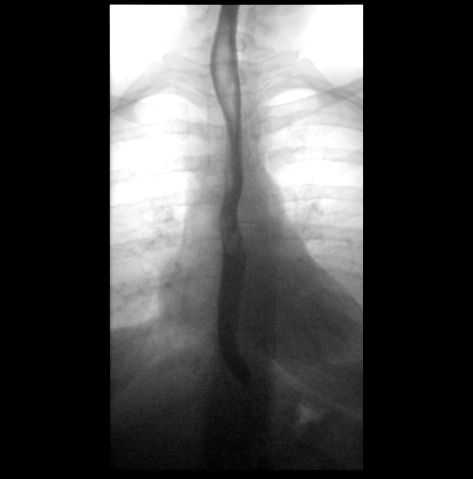
[frame 48/56]
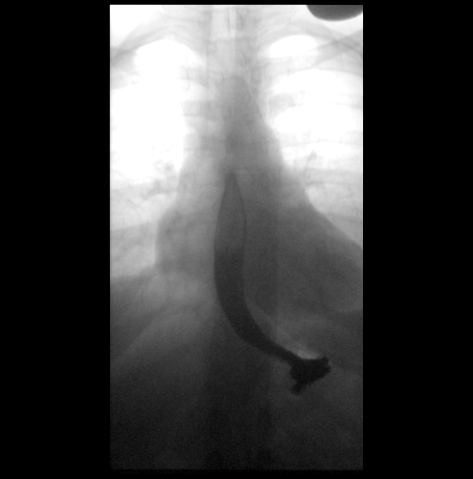

[Series 5: cp_standard · 0.18mm/px · 1 of 1 slices shown (2 of 9)]
[im 1/1]
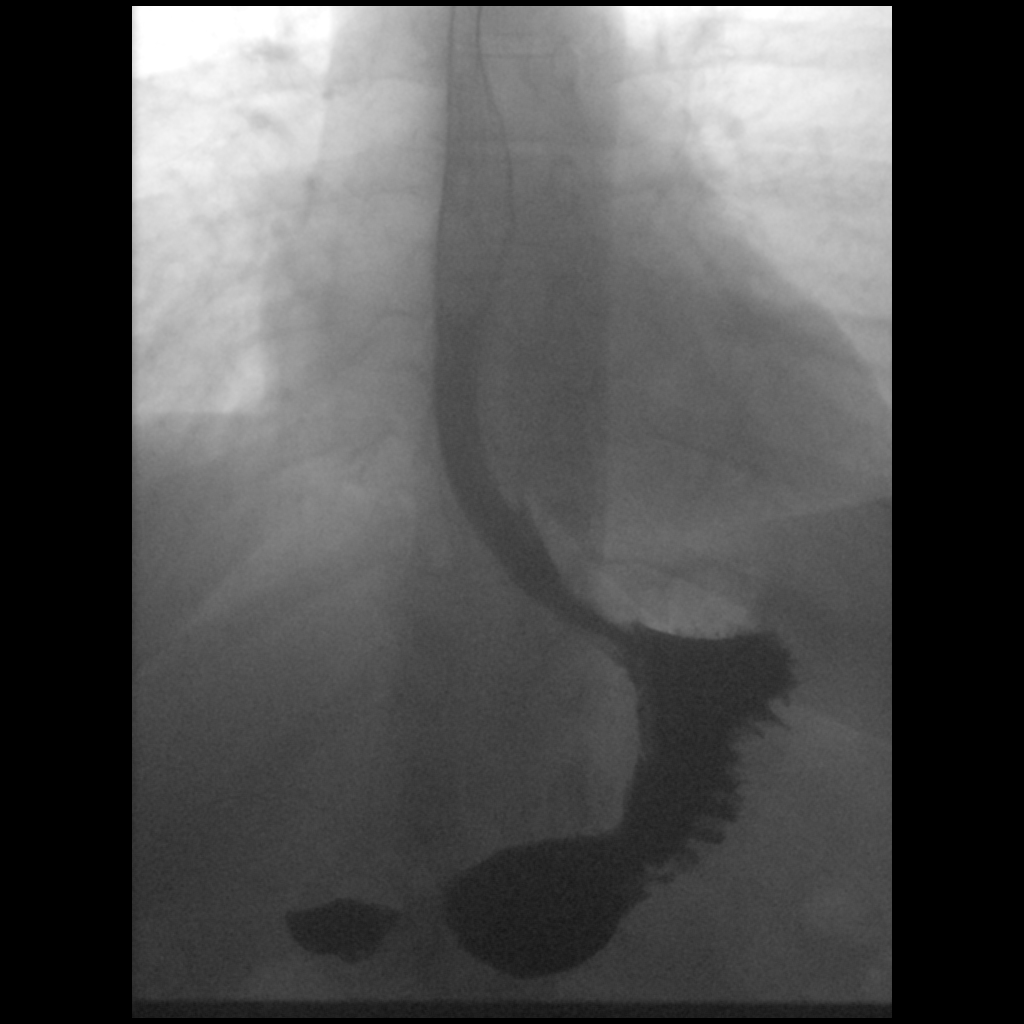

[Series 7: cp_standard · 0.36mm/px · 2 of 26 frames shown (3 of 9)]
[frame 4/26]
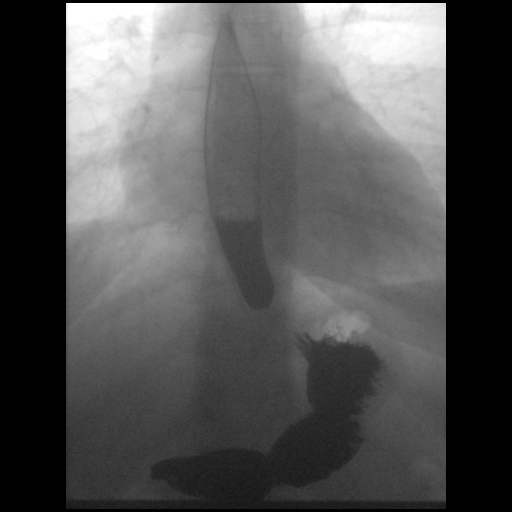
[frame 25/26]
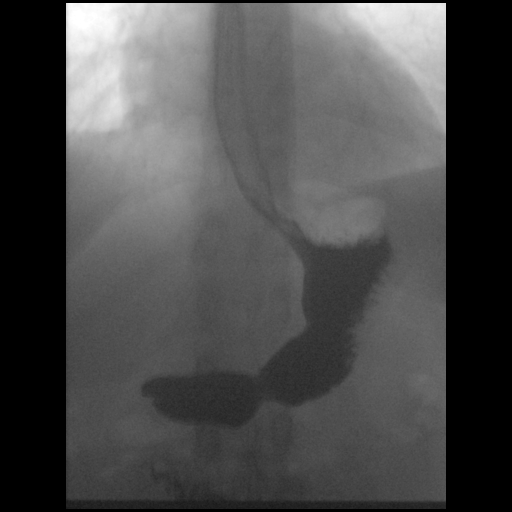

[Series 8: cp_standard · 0.36mm/px · 1 of 68 frames shown (4 of 9)]
[frame 46/68]
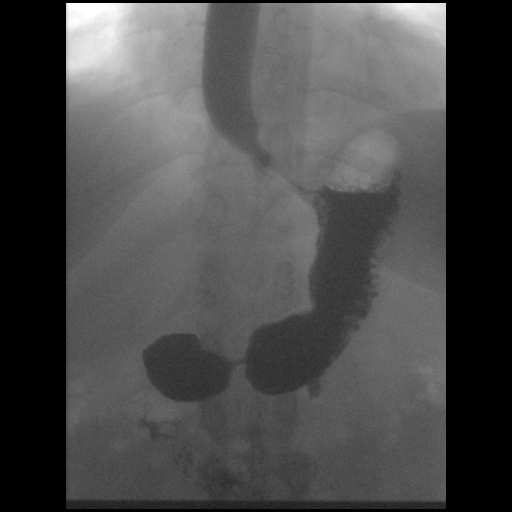

[Series 10: cp_standard · 0.36mm/px · 2 of 54 frames shown (5 of 9)]
[frame 9/54]
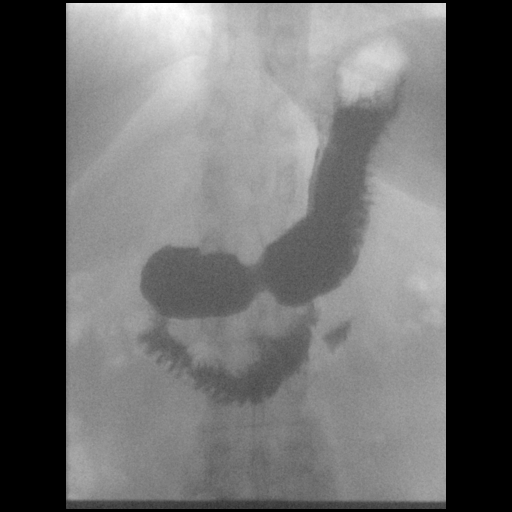
[frame 28/54]
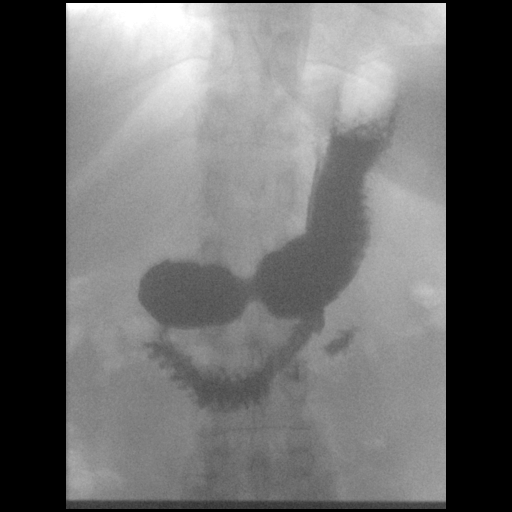

[Series 11: cp_standard · 0.36mm/px · 1 of 111 frames shown (6 of 9)]
[frame 56/111]
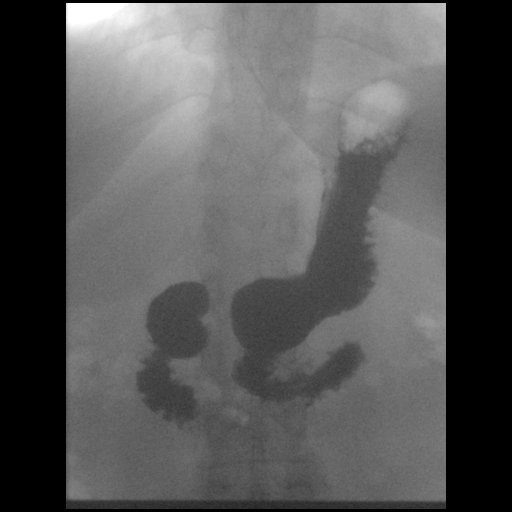

[Series 12: cp_standard · 0.18mm/px · 1 of 1 slices shown (7 of 9)]
[im 1/1]
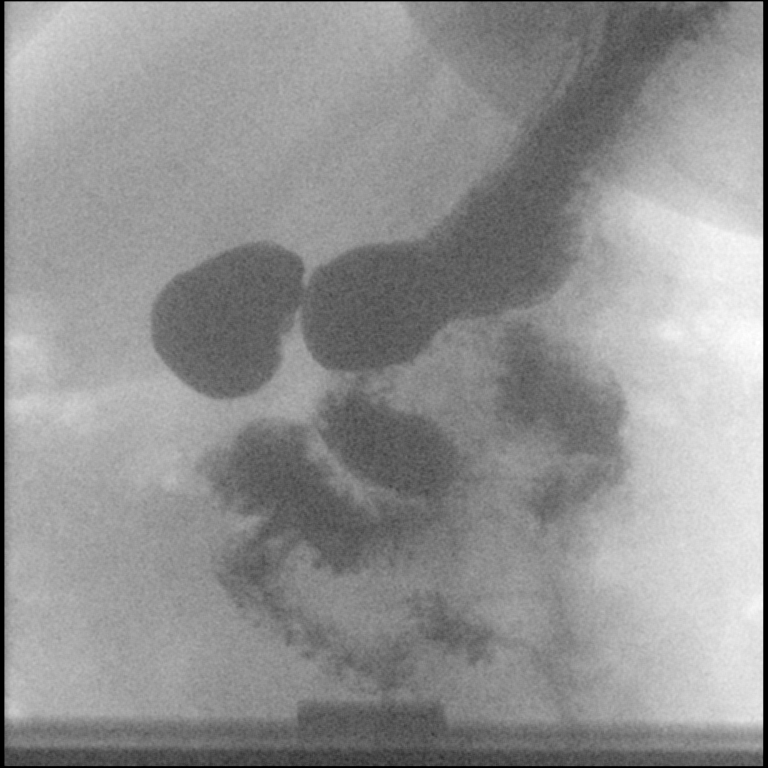

[Series 14: cp_standard · 0.18mm/px · 1 of 1 slices shown (8 of 9)]
[im 1/1]
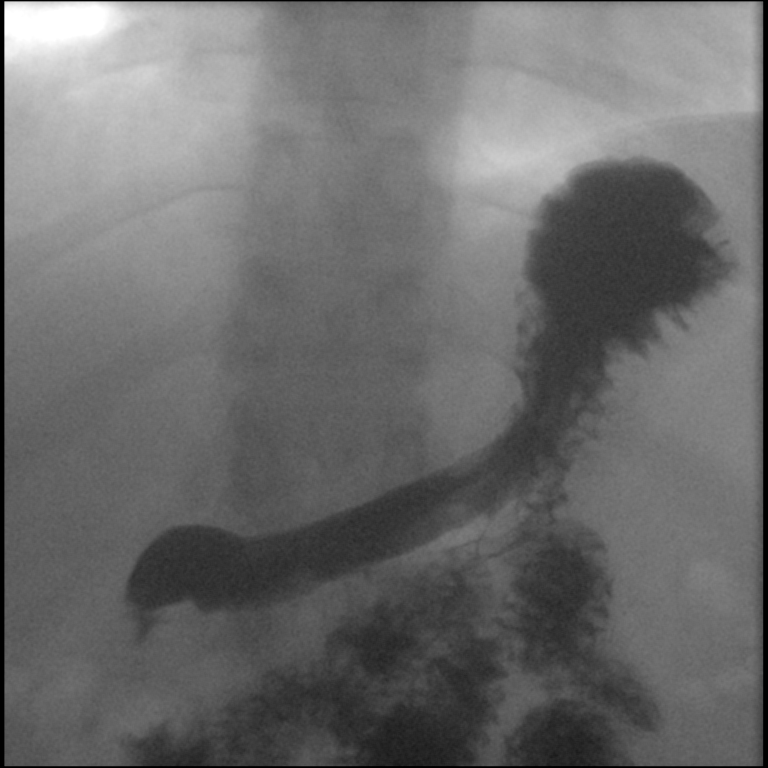

[Series 17: cp_standard · 0.18mm/px · 1 of 1 slices shown (9 of 9)]
[im 1/1]
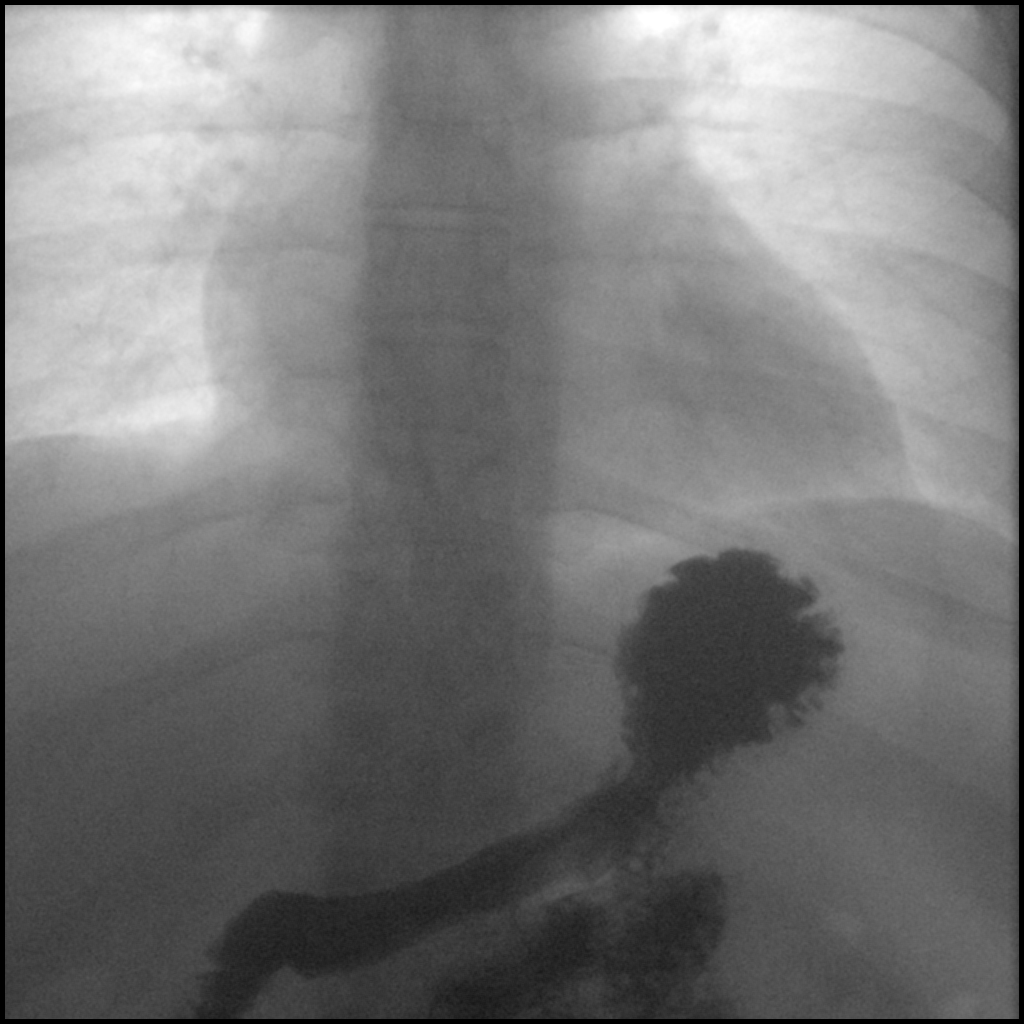

[12 of 24 positions shown; findings below may reference images not displayed]

FINDINGS: Preprocedural scout view. Left lower quadrant sacral region
stimulator device. No acute osseous abnormality identified. Normal
bowel gas pattern. Normal abdominal visceral contours. Negative
visible lung bases.

A double contrast study was undertaken and the patient tolerated
this well and without difficulty.

No obstruction to the forward flow of contrast throughout the
esophagus and into the stomach. Normal esophageal course and
contour.

Normal gastroesophageal junction. Normal gastric contour. Prompt
gastric emptying.

Normal duodenum C-loop configuration. Normal ligament of Treitz
position. The proximal small bowel loops are located in the midline
and in the left upper quadrant.

No gastroesophageal reflux occurred spontaneously or was elicited.
IMPRESSION: Normal preoperative upper GI.

## 2019-01-29 IMAGING — DX DG CHEST 2V
2 series · 2 of 2 positions shown · non-contrast
Comparison: Upper GI series today reported separately.

CLINICAL DATA: 41-year-old female preoperative study for bariatric
surgery.

EXAM:
CHEST  2 VIEW

[chest pa]
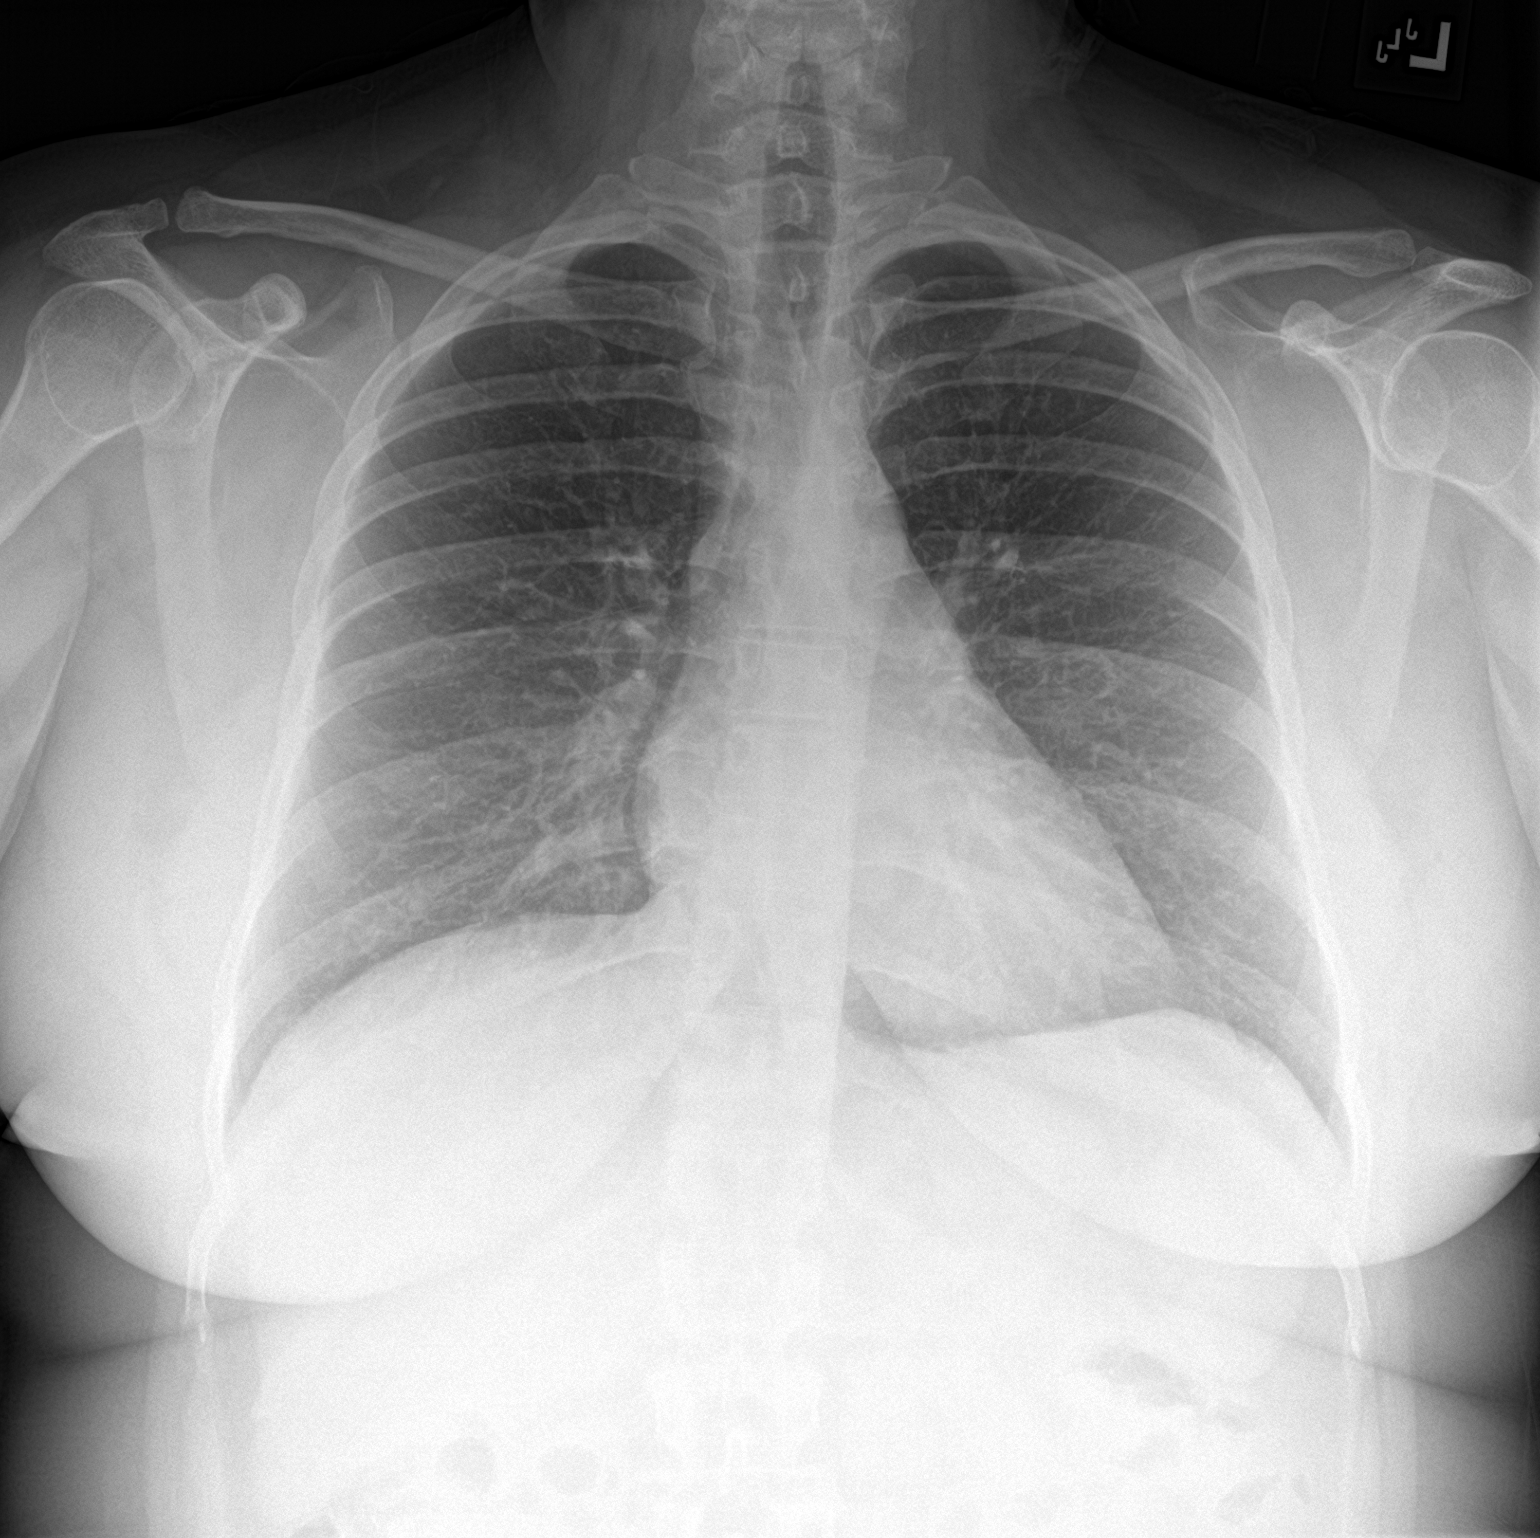

[chest lat]
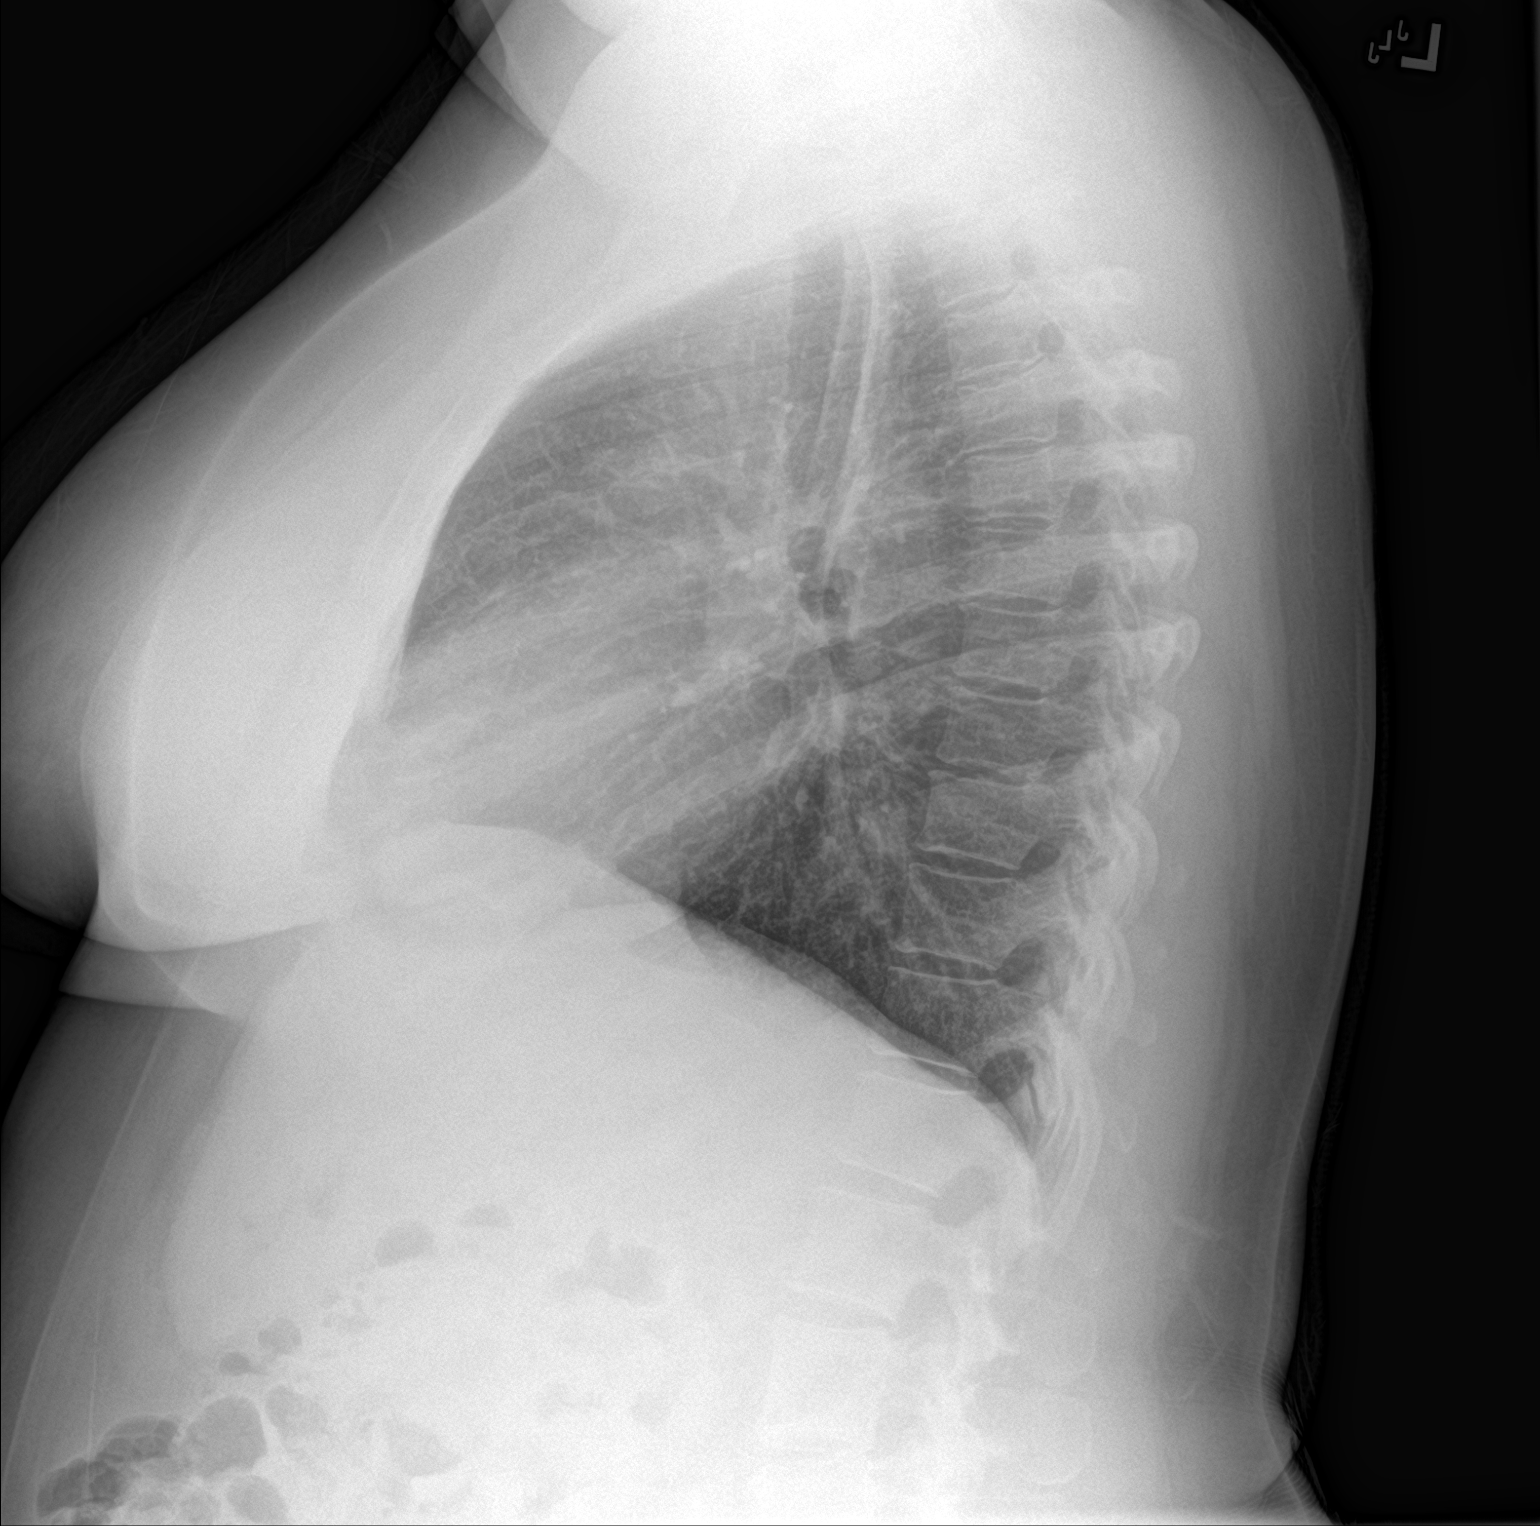

[2 of 2 positions shown; findings below may reference images not displayed]

FINDINGS: Normal lung volumes. Normal cardiac size and mediastinal contours.
Visualized tracheal air column is within normal limits. The lungs
are clear. No pneumothorax or pleural effusion. No pneumoperitoneum.
Negative visible bowel gas pattern. No acute osseous abnormality
identified.
IMPRESSION: Negative.  No acute cardiopulmonary abnormality.

## 2020-04-19 ENCOUNTER — Encounter (HOSPITAL_COMMUNITY): Payer: Self-pay

## 2021-04-18 ENCOUNTER — Encounter (HOSPITAL_COMMUNITY): Payer: Self-pay | Admitting: *Deleted

## 2022-04-24 ENCOUNTER — Encounter (HOSPITAL_COMMUNITY): Payer: Self-pay | Admitting: *Deleted
